# Patient Record
Sex: Male | Born: 1980 | Race: White | Hispanic: No | Marital: Single | Smoking: Never smoker
Health system: Southern US, Community
[De-identification: ages and names within clinical notes are randomized; demographics above are authoritative.]

## PROBLEM LIST (undated history)

## (undated) DIAGNOSIS — I10 Essential (primary) hypertension: Secondary | ICD-10-CM

## (undated) DIAGNOSIS — E785 Hyperlipidemia, unspecified: Secondary | ICD-10-CM

## (undated) DIAGNOSIS — G4730 Sleep apnea, unspecified: Secondary | ICD-10-CM

## (undated) DIAGNOSIS — Z87442 Personal history of urinary calculi: Secondary | ICD-10-CM

## (undated) DIAGNOSIS — G8929 Other chronic pain: Secondary | ICD-10-CM

## (undated) DIAGNOSIS — E559 Vitamin D deficiency, unspecified: Secondary | ICD-10-CM

## (undated) DIAGNOSIS — M109 Gout, unspecified: Secondary | ICD-10-CM

## (undated) DIAGNOSIS — R000 Tachycardia, unspecified: Secondary | ICD-10-CM

## (undated) DIAGNOSIS — J40 Bronchitis, not specified as acute or chronic: Secondary | ICD-10-CM

## (undated) DIAGNOSIS — M549 Dorsalgia, unspecified: Secondary | ICD-10-CM

## (undated) HISTORY — PX: FOOT SURGERY: SHX648

## (undated) HISTORY — PX: HERNIA REPAIR: SHX51

## (undated) HISTORY — DX: Gout, unspecified: M10.9

## (undated) HISTORY — DX: Vitamin D deficiency, unspecified: E55.9

---

## 2000-04-26 ENCOUNTER — Encounter: Payer: Self-pay | Admitting: Emergency Medicine

## 2000-04-26 ENCOUNTER — Emergency Department (HOSPITAL_COMMUNITY): Admission: EM | Admit: 2000-04-26 | Discharge: 2000-04-26 | Payer: Self-pay | Admitting: Emergency Medicine

## 2000-05-31 ENCOUNTER — Encounter: Payer: Self-pay | Admitting: Occupational Medicine

## 2000-05-31 ENCOUNTER — Ambulatory Visit (HOSPITAL_COMMUNITY): Admission: RE | Admit: 2000-05-31 | Discharge: 2000-05-31 | Payer: Self-pay | Admitting: Occupational Medicine

## 2000-06-15 ENCOUNTER — Encounter: Admission: RE | Admit: 2000-06-15 | Discharge: 2000-09-13 | Payer: Self-pay | Admitting: Orthopedic Surgery

## 2000-08-30 ENCOUNTER — Emergency Department (HOSPITAL_COMMUNITY): Admission: EM | Admit: 2000-08-30 | Discharge: 2000-08-31 | Payer: Self-pay | Admitting: Emergency Medicine

## 2001-04-04 ENCOUNTER — Emergency Department (HOSPITAL_COMMUNITY): Admission: EM | Admit: 2001-04-04 | Discharge: 2001-04-04 | Payer: Self-pay | Admitting: Emergency Medicine

## 2001-04-04 ENCOUNTER — Encounter: Payer: Self-pay | Admitting: Emergency Medicine

## 2002-11-20 ENCOUNTER — Ambulatory Visit (HOSPITAL_COMMUNITY): Admission: RE | Admit: 2002-11-20 | Discharge: 2002-11-20 | Payer: Self-pay | Admitting: Podiatry

## 2003-03-28 ENCOUNTER — Ambulatory Visit (HOSPITAL_COMMUNITY): Admission: RE | Admit: 2003-03-28 | Discharge: 2003-03-28 | Payer: Self-pay | Admitting: Podiatry

## 2005-01-14 ENCOUNTER — Emergency Department (HOSPITAL_COMMUNITY): Admission: EM | Admit: 2005-01-14 | Discharge: 2005-01-14 | Payer: Self-pay | Admitting: Family Medicine

## 2005-09-06 ENCOUNTER — Emergency Department (HOSPITAL_COMMUNITY): Admission: EM | Admit: 2005-09-06 | Discharge: 2005-09-06 | Payer: Self-pay | Admitting: Family Medicine

## 2006-04-11 ENCOUNTER — Emergency Department (HOSPITAL_COMMUNITY): Admission: EM | Admit: 2006-04-11 | Discharge: 2006-04-11 | Payer: Self-pay | Admitting: Emergency Medicine

## 2008-03-05 ENCOUNTER — Emergency Department (HOSPITAL_COMMUNITY): Admission: EM | Admit: 2008-03-05 | Discharge: 2008-03-05 | Payer: Self-pay | Admitting: Emergency Medicine

## 2008-05-24 ENCOUNTER — Emergency Department (HOSPITAL_COMMUNITY): Admission: EM | Admit: 2008-05-24 | Discharge: 2008-05-24 | Payer: Self-pay | Admitting: Family Medicine

## 2009-04-13 ENCOUNTER — Emergency Department (HOSPITAL_COMMUNITY): Admission: EM | Admit: 2009-04-13 | Discharge: 2009-04-13 | Payer: Self-pay | Admitting: Emergency Medicine

## 2009-10-26 ENCOUNTER — Emergency Department (HOSPITAL_COMMUNITY): Admission: EM | Admit: 2009-10-26 | Discharge: 2009-10-26 | Payer: Self-pay | Admitting: Family Medicine

## 2010-06-19 ENCOUNTER — Ambulatory Visit (HOSPITAL_COMMUNITY): Admission: RE | Admit: 2010-06-19 | Discharge: 2010-06-19 | Payer: Self-pay | Admitting: Family Medicine

## 2011-03-27 NOTE — Op Note (Signed)
NAME:  Kevin Pugh, Kevin Pugh                           ACCOUNT NO.:  [id number]   MEDICAL RECORD NO.:  [id number]                   PATIENT TYPE:  AMB   LOCATION:  DAY                                  FACILITY:  [facility]   PHYSICIAN:  Denny Peon. Ulice Brilliant, D.P.M.               DATE OF BIRTH:  10/07/1981   DATE OF PROCEDURE:  DATE OF DISCHARGE:                                 OPERATIVE REPORT   PREOPERATIVE DIAGNOSIS:  Nonimproving chronic plantar fasciitis, left foot.   POSTOPERATIVE DIAGNOSIS:  Nonimproving chronic plantar fasciitis, left foot.   PROCEDURE PERFORMED:  Endoscopic plantar fasciotomy, left foot.   SURGEON:  Denny Peon. Ulice Brilliant, D.P.M.   ANESTHESIA:  Monitored anesthesia care.   INDICATIONS FOR SURGERY:  Several year history of classic plantar  fasciitis/heel spur symptom complaints of the left heel.  The patient  has  been treated with functional foot orthotics, nonsteroidal anti-inflammatory  medication, numerous corticosteroid injections, home physical therapy, shock  wave therapy.  All of which has offered little long lasting relief as  patient is not improving, he has been suggested that an endoscopic plantar  fasciotomy is in order. Clinically, he is noted to have a pes valgus foot  type with a plantar calcaneal spur on lateral weightbearing radiograph.  Clinically, he is extremely tender to palpation of the plantar medial and  plantar central calcaneal spur area.   DESCRIPTION OF PROCEDURE:  The patient  is brought into the OR and placed on  the table in supine position.  IV sedation was established.  A posterior  tibial nerve block is performed about his left ankle.  Further local  anesthesia is administered about the medial and lateral aspect of his left  heel.  A pneumatic ankle tourniquet is then applied across his left ankle.  His foot is then prepped and draped in the usual aseptic fashion.  Care is  taken that his foot is hanging off the bed at least four inches so we can  get good exposure.  An Esmarch bandage is then utilized to exsanguinate his  foot.  The tourniquet is inflated to 300 mmHg.   PROCEDURE PERFORMED:  Endoscopic plantar fasciotomy of left foot.  Attention  is directed to the left heel.  The preoperative weightbearing radiograph  measured 5.5 cm from the posterior skin line to the distal most aspect of  the plantar calcaneal spur.  This measurement is reapproximated on the  patient's foot with a sterile ruler.  The spur is also palpated.  A 1-cm  vertical incision is made approximately  1 cm distal to the plantar  calcaneal spur overlying the medial band of the fascia.  The incision is  deepened via blunt dissection with a curved hemostat.  The medial band of  the plantar fascia is well appreciated via tactile sensation through the  curved hemostat.  The channel is then deepened and lengthened running  from  medial to lateral across the plantar aspect of his foot just inferior to the  fascia utilizing the spatula from the EPF instrumentation.  Care is taken to  get broadening and deepening of the tunnel utilizing the spatula.  With the  created, the obturator cannula is inserted medially and driven laterally  through this channel to the lateral aspect of the foot.  A separate lateral  incision is then made.  The obturator cannula is passed through and the  obturator is removed with the cannula rotated so that slot laid directly  against the fascia.  The fiberoptic equipment is inserted medially and the  hook probe is inserted laterally.  The fascia is well visualized on the  monitor.  The lateral most aspect of the central band of the fascia is  roughly approximated utilizing measurements with the hook probe. A  transection is then created at this lateral most aspect of the central band  and utilizing the visualization on the monitor the transection is carried  forth from lateral to medial through the central band and the medial band  with  good visualization of the intrinsic muscle belly lying beneath the  fascia.  The transection is deemed successful at this point.  All work is  checked, then by inserting the camera laterally and the hook probe medially  and the work is inspected and found that the intrinsic muscle belly is  completely visualized from medial through the central band.  The cannula is  then rotated so that its slow lay against the adipose tissue plantarly and  this is well visualized to contain only adipose tissue with no fascia.  The  transection is deemed complete at this point.  The wound is flushed with  copious amounts of irrigant.  A 1cc injection of dexamethasone phosphate is  delivered into the spur site.  The incisions are closed with 4-0 Prolene.  An Adaptic dressing is applied to each incision.  4 x 4's are applied across  the plantar, posterior and dorsal aspect of the foot.  The dressing is  secured.  The draping is removed.  The tourniquet is removed and then the  dressing is carried forth over the ankle.  The tourniquet time before  deflation was 17 minutes.  The patient  is then taken from the table and  rolled over onto the stretcher in the prone position.  His leg is then bent  at the knee and a BK fiberglass cast is applied with the foot being held  passed perpendicular into three to four degrees of dorsiflexion at the  ankle.  This position is maintained while the cast dried.   The patient is transported from the operating room to outpatient recovery  without incident.  While there, a list of written instructions were  explained to he and his wife.  A prescription for Lortab 10/650 is  dispensed.  He will be seen within seven days for his first postop cast  check.  His instructions include being nonweightbearing on this cast on  crutches for the first week.  I will see the patient as stated within a  week.                                              Denny Peon. Ulice Brilliant, D.P.M.     CMD/MEDQ  D:  11/20/2002  T:  11/20/2002  Job:  [id number]

## 2011-03-27 NOTE — H&P (Signed)
   NAME:  Kevin Pugh, Kevin Pugh                           ACCOUNT NO.:  [id number]   MEDICAL RECORD NO.:  [id number]                   PATIENT TYPE:  AMB   LOCATION:  DAY                                  FACILITY:  [facility]   PHYSICIAN:  Denny Peon. Ulice Brilliant, D.P.M.               DATE OF BIRTH:  1981-07-26   DATE OF ADMISSION:  DATE OF DISCHARGE:                                HISTORY & PHYSICAL   HISTORY OF PRESENT ILLNESS:  The patient has chronic plantar fasciitis, heel  spur symptoms of his right foot.  Originally this was of both heels.  He has  been treated conservatively for this right heel with injection therapy,  nonsteroidal anti-inflammatory medication, changes in shoe gear, orthotic  therapy, shock wave therapy.  He has undergone the endoscopic plantar  fasciotomy in his left foot and has improved and would like to go ahead and  do this with his right foot.   PAST MEDICAL HISTORY:  Significant for an eye infection at this point.  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   CURRENT MEDICATIONS:  1. Basically amitriptyline for help with sleeping.  2. He has taken hydrocodone for postoperative pain with his left foot.   PAST SURGICAL HISTORY:  Includes the previous endoscopic plantar fasciotomy  on his left foot in January .   ALLERGIES:  KEFLEX AND PREDNISONE ORALLY.   PHYSICAL EXAMINATION:  The patient has pain to palpation of the right heel  in the plantar medial and plantar central aspect of the heel.  Radiographs  reveal a plantar calcaneal spur, mild of this right foot.   ASSESSMENT:  Plantar fasciitis heel spur syndrome right.   PLAN:  Due to the non improving nature the patient has requested an  endoscopic plantar fasciotomy.  I agree with his reasoning as he has really  had only mild improvement at best with the above-noted measures.  We will go  ahead and schedule  this under MAC anesthesia at [facility]. He has  read his consent form, apparently understood and signed.  We discussed with  him the risks and usual postoperative outcome.                                               Denny Peon. Ulice Brilliant, D.P.M.    CMD/MEDQ  D:  03/27/2003  T:  03/27/2003  Job:  [id number]

## 2011-03-27 NOTE — H&P (Signed)
NAME:  Kevin Pugh, Kevin Pugh                           ACCOUNT NO.:  [id number]   MEDICAL RECORD NO.:  [id number]                   PATIENT TYPE:  AMB   LOCATION:  DAY                                  FACILITY:  [facility]   PHYSICIAN:  Denny Peon. Ulice Brilliant, D.P.M.               DATE OF BIRTH:  02/08/1981   DATE OF ADMISSION:  11/20/2002  DATE OF DISCHARGE:                                HISTORY & PHYSICAL   HISTORY OF PRESENT ILLNESS:  The patient has plantar fasciitis heel spur  symptoms which have been present for over a year.  The patient's condition  has gotten increasingly worse.  He was treated with nonsteroidal anti-  inflammatory medication, functional foot orthotics, and injection therapy,  all of which seemed to offer some relief, but continued to worsen.  On  September 08, 2002, he underwent shock wave therapy in our office to both  feet.  His right foot has improved, however, his left foot has not improved.  He relates that the condition and the feeling are still the same in terms of  extreme discomfort when he first gets up or upon arising after sitting with  a continual throbbing sensation in his plantar heel.   PAST MEDICAL HISTORY:  Noncontributory and essentially unremarkable.   ALLERGIES:  He has a history of seasonal allergies, as well as some  breathing difficulty possibly due to asthma.  He relates an allergy to  [facility] and to PREDNISONE.   MEDICATIONS:  He is taking albuterol and Zyrtec at this point and he  recently has been taking hydrocodone for discomfort.   OBJECTIVE:  The patient is very uncomfortable to deep palpation in the  plantar medial region of this left heel.  Specifically tender to palpation  at the left plantar medial calcaneal tubercle.  Radiographs reveal minimal  calcaneal spurring.   ASSESSMENT:  Plantar fasciitis/heel spur symptom of left foot.   PLAN:  The patient has been through basically all conservative options that  are normally offered.  He has  also undergone shock wave therapy, which has  not eradicated the pain.  We have discussed the endoscopic plantar  fasciotomy.  At this point, I think this is probably warranted because he is  just not improving.  The patient and I have discussed this.  He would like  to have this done at [facility] as he is an employee of the [employer].  We have discussed the procedure with him and the  usual postoperative course.  This will be done under monitored anesthesia  care at [facility] on November 20, 2002.  He will be seen within  seven days for his first postoperative visit.  The patient has read the  consent, apparently understood, and signed.  Denny Peon. Ulice Brilliant, D.P.M.    CMD/MEDQ  D:  11/17/2002  T:  11/17/2002  Job:  [id number]

## 2011-03-27 NOTE — Op Note (Signed)
NAME:  Kevin Pugh, Kevin Pugh                           ACCOUNT NO.:  [id number]   MEDICAL RECORD NO.:  [id number]                   PATIENT TYPE:  AMB   LOCATION:  DAY                                  FACILITY:  [facility]   PHYSICIAN:  Denny Peon. Ulice Brilliant, D.P.M.               DATE OF BIRTH:  Jan 31, 1981   DATE OF PROCEDURE:  03/28/2003  DATE OF DISCHARGE:                                 OPERATIVE REPORT   PREOPERATIVE DIAGNOSIS:  Plantar fasciitis heel spur syndrome, right foot.   POSTOPERATIVE DIAGNOSIS:  Plantar fasciitis heel spur syndrome, right foot.   PROCEDURE:  Endoscopic plantar fasciotomy, right foot.   SURGEON:  Denny Peon. Ulice Brilliant, D.P.M.   ANESTHESIA:  Monitored anesthesia care.   INDICATIONS FOR PROCEDURE:  Painful longstanding plantar fasciitis which has  been treated conservatively fairly aggressively with nonsteroidal  antiinflammatory medication, injection therapy, functional foot orthotics,  time out of work, shockwave therapy, all of which has helped to a limited  extent only to have the condition recur and become more painful. Clinically  very uncomfortable palpation about the medial and central plantar aspect of  the heel. Pain early in the morning or upon arising after sitting, worsening  in the afternoon and stiffening. Pain is localized, not radiating.   DESCRIPTION OF PROCEDURE:  Kevin Pugh is brought into the OR and placed on the  table in the supine position, IV sedation is established, a posterior tibial  nerve block is then performed about his right ankle. Further local  anesthesia is administered about the medial and lateral aspect of the right  heel. A pneumatic ankle tourniquet is then applied across his right ankle  over appropriate cast padding. His foot is then prepped and draped in the  usual standard aseptic fashion. An ACE bandage was then utilized to examine  his foot, the tourniquet is inflated to 250 mmHg.   ENDOSCOPIC PLANTAR FASCIOTOMY OF RIGHT FOOT:  Attention  is directed to the  right heel, the medial calcaneal tubercle is appreciated. An incision is  then planned 1 cm distal to the fall off of the calcaneous over the medial  band of the fascia with the incision planned and checked. It is then made  with a #15 blade. The incision is deepened by a blunt dissection to the  plantar fascia. A channel is then created working inferior to the fascia  from medial to lateral across the plantar aspect of the foot. This is  created with a freer elevator and then broadened and deepened with the  spatula from the ____ with instrumentation. The obturator cannula was then  introduced and driven from medial to lateral across the plantar aspect of  the foot in this channel. A separate incision is made laterally to past the  obturator through. With this in place, the cannula was rotated so that its  slot lay against the fascia and the obturator was  removed. The camera was  inserted medially and the hook probe was inserted laterally. The lateral  most aspect of the central band of the fascia is approximated. A transection  is then made here with a triangle knife. This transection is then worked  from lateral to medial across the plantar aspect of the foot. The cannula  was then rotated so that its slot lay against the adipose tissue plantarly  and there is no remaining plantar aponeurosis here. The instrumentation is  then reversed, the camera is inserted laterally and there is found to be no  remaining plantar fascial tissue intact. The intrinsic muscle belly is well  visualized in the area of the transection. The fasciotomy is deemed  complete, the wound is flushed with copious amounts of irrigant, the cannula  is removed. The incisions are closed with 4-0 Prolene, postoperative  injection of Hexadrol is dispensed. Adaptic dressing is applied across each  incision, a dry sterile compressive dressing follows. The tourniquet was  deflated with tourniquet time 20  minutes. The tourniquet was then removed  and the dressing was taken up to the lower leg. The patient is then fitted  with a Cam walker.   Kevin Pugh tolerated this procedure well, transported to day hospital without  incident, while there a list of instructions were explained to him. He will  be seen within seven days for first postop visit.                                               Denny Peon. Ulice Brilliant, D.P.M.    CMD/MEDQ  D:  03/28/2003  T:  03/28/2003  Job:  [id number]

## 2011-04-02 ENCOUNTER — Ambulatory Visit: Payer: Medicaid Other | Attending: Family Medicine | Admitting: Physical Therapy

## 2011-04-02 DIAGNOSIS — R5381 Other malaise: Secondary | ICD-10-CM | POA: Insufficient documentation

## 2011-04-02 DIAGNOSIS — IMO0001 Reserved for inherently not codable concepts without codable children: Secondary | ICD-10-CM | POA: Insufficient documentation

## 2011-04-02 DIAGNOSIS — M545 Low back pain, unspecified: Secondary | ICD-10-CM | POA: Insufficient documentation

## 2011-04-02 DIAGNOSIS — R293 Abnormal posture: Secondary | ICD-10-CM | POA: Insufficient documentation

## 2011-04-08 ENCOUNTER — Encounter: Payer: Medicaid Other | Admitting: Physical Therapy

## 2011-04-09 ENCOUNTER — Encounter: Payer: Medicaid Other | Admitting: *Deleted

## 2011-04-10 ENCOUNTER — Ambulatory Visit: Payer: Medicaid Other | Attending: Family Medicine | Admitting: Physical Therapy

## 2011-04-10 DIAGNOSIS — M545 Low back pain, unspecified: Secondary | ICD-10-CM | POA: Insufficient documentation

## 2011-04-10 DIAGNOSIS — R293 Abnormal posture: Secondary | ICD-10-CM | POA: Insufficient documentation

## 2011-04-10 DIAGNOSIS — IMO0001 Reserved for inherently not codable concepts without codable children: Secondary | ICD-10-CM | POA: Insufficient documentation

## 2011-04-10 DIAGNOSIS — R5381 Other malaise: Secondary | ICD-10-CM | POA: Insufficient documentation

## 2011-04-15 ENCOUNTER — Ambulatory Visit: Payer: Medicaid Other | Admitting: Physical Therapy

## 2011-06-11 ENCOUNTER — Other Ambulatory Visit: Payer: Self-pay | Admitting: Nurse Practitioner

## 2011-06-11 ENCOUNTER — Other Ambulatory Visit: Payer: Self-pay | Admitting: Family Medicine

## 2011-06-11 DIAGNOSIS — M545 Low back pain: Secondary | ICD-10-CM

## 2011-06-11 DIAGNOSIS — M79605 Pain in left leg: Secondary | ICD-10-CM

## 2011-06-12 ENCOUNTER — Ambulatory Visit
Admission: RE | Admit: 2011-06-12 | Discharge: 2011-06-12 | Disposition: A | Discharge status: Home / Self Care | Payer: Medicaid Other | Source: Ambulatory Visit | Attending: Family Medicine | Admitting: Family Medicine

## 2011-06-12 DIAGNOSIS — M545 Low back pain: Secondary | ICD-10-CM

## 2011-06-12 DIAGNOSIS — M79605 Pain in left leg: Secondary | ICD-10-CM

## 2011-06-15 ENCOUNTER — Other Ambulatory Visit (HOSPITAL_COMMUNITY): Payer: Medicaid Other

## 2011-07-06 ENCOUNTER — Ambulatory Visit: Payer: Medicaid Other | Admitting: Physical Therapy

## 2011-07-08 ENCOUNTER — Ambulatory Visit: Payer: Medicaid Other | Admitting: Physical Therapy

## 2011-08-23 ENCOUNTER — Emergency Department (INDEPENDENT_AMBULATORY_CARE_PROVIDER_SITE_OTHER): Payer: Medicaid Other

## 2011-08-23 ENCOUNTER — Emergency Department (HOSPITAL_BASED_OUTPATIENT_CLINIC_OR_DEPARTMENT_OTHER)
Admission: EM | Admit: 2011-08-23 | Discharge: 2011-08-23 | Disposition: A | Discharge status: Home / Self Care | Payer: Medicaid Other | Attending: Emergency Medicine | Admitting: Emergency Medicine

## 2011-08-23 ENCOUNTER — Other Ambulatory Visit: Payer: Self-pay

## 2011-08-23 DIAGNOSIS — R0789 Other chest pain: Secondary | ICD-10-CM

## 2011-08-23 DIAGNOSIS — R112 Nausea with vomiting, unspecified: Secondary | ICD-10-CM | POA: Insufficient documentation

## 2011-08-23 DIAGNOSIS — I10 Essential (primary) hypertension: Secondary | ICD-10-CM | POA: Insufficient documentation

## 2011-08-23 DIAGNOSIS — E785 Hyperlipidemia, unspecified: Secondary | ICD-10-CM | POA: Insufficient documentation

## 2011-08-23 DIAGNOSIS — R091 Pleurisy: Secondary | ICD-10-CM | POA: Insufficient documentation

## 2011-08-23 DIAGNOSIS — J40 Bronchitis, not specified as acute or chronic: Secondary | ICD-10-CM

## 2011-08-23 DIAGNOSIS — R079 Chest pain, unspecified: Secondary | ICD-10-CM | POA: Insufficient documentation

## 2011-08-23 DIAGNOSIS — J45909 Unspecified asthma, uncomplicated: Secondary | ICD-10-CM | POA: Insufficient documentation

## 2011-08-23 HISTORY — DX: Essential (primary) hypertension: I10

## 2011-08-23 HISTORY — DX: Hyperlipidemia, unspecified: E78.5

## 2011-08-23 HISTORY — DX: Dorsalgia, unspecified: M54.9

## 2011-08-23 HISTORY — DX: Other chronic pain: G89.29

## 2011-08-23 HISTORY — DX: Bronchitis, not specified as acute or chronic: J40

## 2011-08-23 LAB — COMPREHENSIVE METABOLIC PANEL
ALT: 32 U/L (ref 0–53)
AST: 23 U/L (ref 0–37)
Albumin: 4 g/dL (ref 3.5–5.2)
Alkaline Phosphatase: 58 U/L (ref 39–117)
BUN: 18 mg/dL (ref 6–23)
CO2: 25 mEq/L (ref 19–32)
Calcium: 9.6 mg/dL (ref 8.4–10.5)
Chloride: 103 mEq/L (ref 96–112)
Creatinine, Ser: 1.1 mg/dL (ref 0.50–1.35)
GFR calc Af Amer: >90 mL/min (ref >90)
GFR calc non Af Amer: 88 mL/min — ABNORMAL LOW (ref >90)
Glucose, Bld: 115 mg/dL — ABNORMAL HIGH (ref 70–99)
Potassium: 3.8 mEq/L (ref 3.5–5.1)
Sodium: 139 mEq/L (ref 135–145)
Total Bilirubin: 0.2 mg/dL — ABNORMAL LOW (ref 0.3–1.2)
Total Protein: 7.1 g/dL (ref 6.0–8.3)

## 2011-08-23 LAB — LIPASE, BLOOD: Lipase: 40 U/L (ref 11–59)

## 2011-08-23 LAB — URINALYSIS, ROUTINE W REFLEX MICROSCOPIC
Glucose, UA: NEGATIVE mg/dL
Hgb urine dipstick: NEGATIVE
Ketones, ur: NEGATIVE mg/dL
Leukocytes, UA: NEGATIVE
Nitrite: NEGATIVE
Protein, ur: NEGATIVE mg/dL
Specific Gravity, Urine: 1.033 — ABNORMAL HIGH (ref 1.005–1.030)
Urobilinogen, UA: 0.2 mg/dL (ref 0.0–1.0)
pH: 5 (ref 5.0–8.0)

## 2011-08-23 LAB — TROPONIN I: Troponin I: <0.3 ng/mL (ref <0.30)

## 2011-08-23 LAB — CBC
HCT: 41 % (ref 39.0–52.0)
Hemoglobin: 14.1 g/dL (ref 13.0–17.0)
MCH: 30.3 pg (ref 26.0–34.0)
MCHC: 34.4 g/dL (ref 30.0–36.0)
MCV: 88.2 fL (ref 78.0–100.0)
Platelets: 293 10*3/uL (ref 150–400)
RBC: 4.65 MIL/uL (ref 4.22–5.81)
RDW: 13.1 % (ref 11.5–15.5)
WBC: 10.1 10*3/uL (ref 4.0–10.5)

## 2011-08-23 LAB — DIFFERENTIAL
Basophils Absolute: 0 10*3/uL (ref 0.0–0.1)
Basophils Relative: 0 % (ref 0–1)
Eosinophils Absolute: 0.2 10*3/uL (ref 0.0–0.7)
Eosinophils Relative: 2 % (ref 0–5)
Lymphocytes Relative: 24 % (ref 12–46)
Lymphs Abs: 2.4 10*3/uL (ref 0.7–4.0)
Monocytes Absolute: 0.8 10*3/uL (ref 0.1–1.0)
Monocytes Relative: 8 % (ref 3–12)
Neutro Abs: 6.6 10*3/uL (ref 1.7–7.7)
Neutrophils Relative %: 66 % (ref 43–77)

## 2011-08-23 LAB — D-DIMER, QUANTITATIVE: D-Dimer, Quant: <0.22 ug/mL-FEU (ref 0.00–0.48)

## 2011-08-23 MED ORDER — HYDROMORPHONE HCL 1 MG/ML IJ SOLN
INTRAMUSCULAR | Status: AC
  Administered 2011-08-23: 1 mg via INTRAVENOUS
  Filled 2011-08-23: qty 1

## 2011-08-23 MED ORDER — OXYCODONE-ACETAMINOPHEN 5-325 MG PO TABS: 1.0000 | ORAL_TABLET | Freq: Four times a day (QID) | ORAL | Status: AC | PRN

## 2011-08-23 MED ORDER — ONDANSETRON HCL 4 MG/2ML IJ SOLN
4.0000 mg | Freq: Once | INTRAMUSCULAR | Status: AC
  Administered 2011-08-23: 4 mg via INTRAVENOUS

## 2011-08-23 MED ORDER — HYDROMORPHONE HCL 1 MG/ML IJ SOLN
1.0000 mg | Freq: Once | INTRAMUSCULAR | Status: AC
  Administered 2011-08-23: 1 mg via INTRAVENOUS

## 2011-08-23 MED ORDER — ONDANSETRON HCL 4 MG/2ML IJ SOLN
INTRAMUSCULAR | Status: AC
  Administered 2011-08-23: 4 mg via INTRAVENOUS
  Filled 2011-08-23: qty 2

## 2011-08-23 MED ORDER — OXYCODONE-ACETAMINOPHEN 5-325 MG PO TABS
ORAL_TABLET | ORAL | Status: AC
  Administered 2011-08-23: 1 via ORAL
  Filled 2011-08-23: qty 1

## 2011-08-23 MED ORDER — OXYCODONE-ACETAMINOPHEN 5-325 MG PO TABS
1.0000 | ORAL_TABLET | Freq: Once | ORAL | Status: AC
  Administered 2011-08-23: 1 via ORAL

## 2011-08-23 MED ORDER — NAPROXEN 500 MG PO TABS: 500.0000 mg | ORAL_TABLET | Freq: Two times a day (BID) | ORAL | Status: AC

## 2011-08-23 MED ORDER — IBUPROFEN 800 MG PO TABS
ORAL_TABLET | ORAL | Status: AC
  Administered 2011-08-23: 800 mg via ORAL
  Filled 2011-08-23: qty 1

## 2011-08-23 MED ORDER — IBUPROFEN 800 MG PO TABS
800.0000 mg | ORAL_TABLET | Freq: Once | ORAL | Status: AC
  Administered 2011-08-23: 800 mg via ORAL

## 2011-08-23 NOTE — ED Notes (Signed)
Pt states that he has right sided chest pain which started about 1730 today, not associated with sob, however pt reports nausea, vomiting.  Increased WOB observed but pt reports no sob.

## 2011-08-23 NOTE — ED Provider Notes (Signed)
Scribed for Celene Kras, MD, the patient was seen in room MH08/MH08 . This chart was scribed by Ellie Lunch. This patient's care was started at 8:10 PM.   CSN: [id number] Arrival date & time: 08/23/2011  8:00 PM  Chief Complaint  Patient presents with  . Chest Pain    right sided chest pain    (Consider location/radiation/quality/duration/timing/severity/associated sxs/prior treatment) HPI Kevin Pugh is a 30 y.o. male who presents to the Emergency Department complaining of right sided chest pain starting about 3 hours ago.  Pain is described as sharp and radiates to back. Pt reports associated nausea and vomiting related to pain. Pt reports nothing aggravates or improves pain. Pt reports no hx of similar symptoms. Pt has recently had bronchitis. Pt denies SOB, urinary issues, edema or abd pain. Denies any recent long trips or hx of blood clots. There are no other associated symptoms and no other alleviating or aggravating factors.     Past Medical History  Diagnosis Date  . Hypertension   . Hyperlipemia   . Asthma   . Chronic back pain   . Bronchitis     Past Surgical History  Procedure Date  . Foot surgery     History reviewed. No pertinent family history.  History  Substance Use Topics  . Smoking status: Never Smoker   . Smokeless tobacco: Current User    Types: Snuff  . Alcohol Use: No      Review of Systems  Respiratory: Negative for shortness of breath.   Cardiovascular: Positive for chest pain. Negative for leg swelling.  Gastrointestinal: Positive for nausea and vomiting.  Genitourinary: Negative for dysuria and difficulty urinating.  All other systems reviewed and are negative.    Allergies  Prednisone  Home Medications  No current outpatient prescriptions on file.  BP 153/92  Pulse 96  Temp(Src) 98 F (36.7 C) (Oral)  Resp 17  Ht 6\' 1"  (1.854 m)  Wt 380 lb (172.367 kg)  BMI 50.13 kg/m2  SpO2 100%  Physical Exam  Nursing note and  vitals reviewed. Constitutional: He appears well-developed and well-nourished.       Appears uncomfortable.  Morbidly obese  HENT:  Head: Normocephalic and atraumatic.  Right Ear: External ear normal.  Left Ear: External ear normal.  Eyes: Conjunctivae are normal. Right eye exhibits no discharge. Left eye exhibits no discharge. No scleral icterus.  Neck: Neck supple. No tracheal deviation present.  Cardiovascular: Normal rate, regular rhythm and intact distal pulses.   Pulmonary/Chest: Effort normal and breath sounds normal. No stridor. No respiratory distress. He has no wheezes. He has no rales.  Abdominal: Soft. Bowel sounds are normal. He exhibits no distension. There is tenderness (Mild RUQ). There is no rebound, no guarding and no CVA tenderness.  Musculoskeletal: He exhibits no edema and no tenderness.       No cords on lower extremities  Neurological: He is alert. He has normal strength. No sensory deficit. Cranial nerve deficit:  no gross defecits noted. He exhibits normal muscle tone. He displays no seizure activity. Coordination normal.  Skin: Skin is warm and dry. No rash noted.  Psychiatric: He has a normal mood and affect.    ED Course  Procedures (including critical care time)  Date: 08/23/2011  Rate: 99  Rhythm: normal sinus rhythm  QRS Axis: right  Intervals: normal  ST/T Wave abnormalities: normal  Conduction Disutrbances:none  Narrative Interpretation:   Old EKG Reviewed: none available   OTHER DATA REVIEWED: Nursing  notes, vital signs, and past medical records reviewed.  DIAGNOSTIC STUDIES: Oxygen Saturation is 100% on room air, normal by my interpretation.    LABS / RADIOLOGY:  Labs Reviewed  COMPREHENSIVE METABOLIC PANEL - Abnormal; Notable for the following:    Glucose, Bld 115 (*)    Total Bilirubin 0.2 (*)    GFR calc non Af Amer 88 (*)    All other components within normal limits  URINALYSIS, ROUTINE W REFLEX MICROSCOPIC - Abnormal; Notable for  the following:    Specific Gravity, Urine 1.033 (*)    Bilirubin Urine SMALL (*)    All other components within normal limits  TROPONIN I  CBC  DIFFERENTIAL  D-DIMER, QUANTITATIVE  LIPASE, BLOOD   Dg Chest 2 View  08/23/2011  *RADIOLOGY REPORT*  Clinical Data: Right chest pain.  CHEST - 2 VIEW  Comparison: None.  Findings: Heart and mediastinal contours are within normal limits. No focal opacities or effusions.  No acute bony abnormality.  IMPRESSION: No active disease.  Original Report Authenticated By: Cyndie Chime, M.D.    ED COURSE / COORDINATION OF CARE: Patient treated with IV pain medications. Patient noted some relief of his symptoms. MDM: Patient without signs of pneumothorax, pneumonia on the workup. Low risk for PE with a negative d-dimer. I doubt cardiac etiology. Did discuss the possibility of biliary colic with his right-sided pain however he has not had any association with food and his LFTs and lipase are normal here.  Cautions were discussed with the patient although overall I feel he is sobered bronchitis and pleurisy.  MEDICATIONS GIVEN IN THE E.D.  Medications  HYDROmorphone (DILAUDID) injection 1 mg   ondansetron (ZOFRAN) injection 4 mg   HYDROmorphone (DILAUDID) 1 MG/ML injection   ondansetron (ZOFRAN) 4 MG/2ML injection   SCRIBE ATTESTATION: I personally performed the services described in this documentation, which was scribed in my presence.  The recorded information has been reviewed and considered.         Celene Kras, MD 08/23/11 2154

## 2011-08-23 NOTE — ED Notes (Signed)
ekg shown to Dr Lynelle Doctor

## 2011-08-23 NOTE — ED Notes (Signed)
Pt sent to restroom to provide urine specimen, instructed on clean catch procedure, voiced understanding.

## 2013-02-02 ENCOUNTER — Ambulatory Visit (INDEPENDENT_AMBULATORY_CARE_PROVIDER_SITE_OTHER): Payer: Medicaid Other

## 2013-02-02 ENCOUNTER — Ambulatory Visit (INDEPENDENT_AMBULATORY_CARE_PROVIDER_SITE_OTHER): Payer: Medicaid Other | Admitting: General Practice

## 2013-02-02 ENCOUNTER — Encounter: Payer: Self-pay | Admitting: General Practice

## 2013-02-02 VITALS — BP 150/98 | HR 90 | Temp 97.2°F | Ht 73.0 in | Wt 392.0 lb

## 2013-02-02 DIAGNOSIS — I10 Essential (primary) hypertension: Secondary | ICD-10-CM

## 2013-02-02 DIAGNOSIS — M79642 Pain in left hand: Secondary | ICD-10-CM

## 2013-02-02 DIAGNOSIS — M79609 Pain in unspecified limb: Secondary | ICD-10-CM

## 2013-02-02 MED ORDER — LISINOPRIL 40 MG PO TABS: 40.0000 mg | ORAL_TABLET | Freq: Every day | ORAL | Status: DC

## 2013-02-02 NOTE — Patient Instructions (Addendum)
Hypertension As your heart beats, it forces blood through your arteries. This force is your blood pressure. If the pressure is too high, it is called hypertension (HTN) or high blood pressure. HTN is dangerous because you may have it and not know it. High blood pressure may mean that your heart has to work harder to pump blood. Your arteries may be narrow or stiff. The extra work puts you at risk for heart disease, stroke, and other problems.  Blood pressure consists of two numbers, a higher number over a lower, 110/72, for example. It is stated as "110 over 72." The ideal is below 120 for the top number (systolic) and under 80 for the bottom (diastolic). Write down your blood pressure today. You should pay close attention to your blood pressure if you have certain conditions such as:  Heart failure.  Prior heart attack.  Diabetes  Chronic kidney disease.  Prior stroke.  Multiple risk factors for heart disease. To see if you have HTN, your blood pressure should be measured while you are seated with your arm held at the level of the heart. It should be measured at least twice. A one-time elevated blood pressure reading (especially in the Emergency Department) does not mean that you need treatment. There may be conditions in which the blood pressure is different between your right and left arms. It is important to see your caregiver soon for a recheck. Most people have essential hypertension which means that there is not a specific cause. This type of high blood pressure may be lowered by changing lifestyle factors such as:  Stress.  Smoking.  Lack of exercise.  Excessive weight.  Drug/tobacco/alcohol use.  Eating less salt. Most people do not have symptoms from high blood pressure until it has caused damage to the body. Effective treatment can often prevent, delay or reduce that damage. TREATMENT  When a cause has been identified, treatment for high blood pressure is directed at the  cause. There are a large number of medications to treat HTN. These fall into several categories, and your caregiver will help you select the medicines that are best for you. Medications may have side effects. You should review side effects with your caregiver. If your blood pressure stays high after you have made lifestyle changes or started on medicines,   Your medication(s) may need to be changed.  Other problems may need to be addressed.  Be certain you understand your prescriptions, and know how and when to take your medicine.  Be sure to follow up with your caregiver within the time frame advised (usually within two weeks) to have your blood pressure rechecked and to review your medications.  If you are taking more than one medicine to lower your blood pressure, make sure you know how and at what times they should be taken. Taking two medicines at the same time can result in blood pressure that is too low. SEEK IMMEDIATE MEDICAL CARE IF:  You develop a severe headache, blurred or changing vision, or confusion.  You have unusual weakness or numbness, or a faint feeling.  You have severe chest or abdominal pain, vomiting, or breathing problems. MAKE SURE YOU:   Understand these instructions.  Will watch your condition.  Will get help right away if you are not doing well or get worse. Document Released: 10/26/2005 Document Revised: 01/18/2012 Document Reviewed: 06/15/2008 [facility] Patient Information 2013 [place], [place].  Wrist Pain Wrist injuries are frequent in adults and children. A sprain is an injury  to the ligaments that hold your bones together. A strain is an injury to muscle or muscle cord-like structures (tendons) from stretching or pulling. Generally, when wrists are moderately tender to touch following a fall or injury, a break in the bone (fracture) may be present. Most wrist sprains or strains are better in 3 to 5 days, but complete healing may take several weeks. HOME  CARE INSTRUCTIONS   Put ice on the injured area.  Put ice in a plastic bag.  Place a towel between your skin and the bag.  Leave the ice on for 15 to 20 minutes, 3 to 4 times a day, for the first 2 days.  Keep your arm raised above the level of your heart whenever possible to reduce swelling and pain.  Rest the injured area for at least 48 hours or as directed by your caregiver.  If a splint or elastic bandage has been applied, use it for as long as directed by your caregiver or until seen by a caregiver for a follow-up exam.  Only take over-the-counter or prescription medicines for pain, discomfort, or fever as directed by your caregiver.  Keep all follow-up appointments. You may need to follow up with a specialist or have follow-up X-rays. Improvement in pain level is not a guarantee that you did not fracture a bone in your wrist. The only way to determine whether or not you have a broken bone is by X-ray. SEEK IMMEDIATE MEDICAL CARE IF:   Your fingers are swollen, very red, white, or cold and blue.  Your fingers are numb or tingling.  You have increasing pain.  You have difficulty moving your fingers. MAKE SURE YOU:   Understand these instructions.  Will watch your condition.  Will get help right away if you are not doing well or get worse. Document Released: 08/05/2005 Document Revised: 01/18/2012 Document Reviewed: 12/17/2010 [facility] Patient Information 2013 [place], [place].

## 2013-02-02 NOTE — Progress Notes (Signed)
  Subjective:    Patient ID: Kevin Pugh, male    DOB: [birth year], 32 y.o.   MRN: [id number]  HPI Patient presets today for blood pressure reading follow up. Previously 146/96 and today 150/98. Patient reports blood pressure when recorded at home 150-160/80-90. Patient reports eating a regular diet, but trying to decrease salt intake. Reports having no schedule routine to activity. Denies participating in activity that increases heart rate for 30 or more minutes a day. Patient reports he will increase physical activity now that weather is warming up, work on weight reduction, also healthier eating habits. Patient's blood pressure medication was recently increased and patient reports wanting to remain only on this medication for know and really work on the above management techniques.  Patient also complains of left lateral hand and wrist pain, from a fall 2 weeks ago. Complains of pain with certain movements. Patient tried to break his fall and landed on his left hand. Denies soaking hand. Tried antiinflammatory meds without success. Denies bleeding from injury.    Review of Systems  Constitutional: Negative for fever and chills.  Respiratory: Negative for chest tightness and shortness of breath.   Cardiovascular: Negative for chest pain and palpitations.  Genitourinary: Negative for difficulty urinating.  Neurological: Negative for dizziness and headaches.  Psychiatric/Behavioral: Negative.        Objective:   Physical Exam  Constitutional: He is oriented to person, place, and time. He appears well-developed and well-nourished.  Cardiovascular: Normal rate and regular rhythm.   Pulmonary/Chest: Effort normal and breath sounds normal.  Musculoskeletal: He exhibits no edema and no tenderness.  Patient unable to fully flex and dorsiflex left hand/wrist area due to pain. Tender with palpation to left lateral hand and wrist area.   Neurological: He is alert and oriented to person, place, and  time.  Skin: Skin is warm and dry.    [facility] reading (PRIMARY) by Ruthell Rummage, FNP-C,  No fracture or dislocation noted.                                       Assessment & Plan:  Xray pending (left hand) Take medications as prescribed Healthy eating habits and exercise Schedule appointment in 2 weeks for recheck and labs Patient verbalized understanding  Raymon Mutton, FNP-C

## 2013-02-22 ENCOUNTER — Ambulatory Visit: Payer: Medicaid Other | Admitting: General Practice

## 2013-03-06 ENCOUNTER — Telehealth: Payer: Self-pay | Admitting: Nurse Practitioner

## 2013-03-06 NOTE — Telephone Encounter (Signed)
Pt aware that we do not have any available appts today.  He really wanted to be seen today so i advised him to try the urgent care.

## 2013-03-23 ENCOUNTER — Other Ambulatory Visit: Payer: Self-pay

## 2013-03-23 DIAGNOSIS — I10 Essential (primary) hypertension: Secondary | ICD-10-CM

## 2013-03-23 MED ORDER — LISINOPRIL 40 MG PO TABS: 40.0000 mg | ORAL_TABLET | Freq: Every day | ORAL | Status: DC

## 2013-06-14 ENCOUNTER — Ambulatory Visit (INDEPENDENT_AMBULATORY_CARE_PROVIDER_SITE_OTHER): Payer: Medicaid Other | Admitting: General Practice

## 2013-06-14 ENCOUNTER — Encounter: Payer: Self-pay | Admitting: General Practice

## 2013-06-14 VITALS — BP 150/99 | HR 110 | Temp 98.3°F | Wt 380.0 lb

## 2013-06-14 DIAGNOSIS — M109 Gout, unspecified: Secondary | ICD-10-CM

## 2013-06-14 DIAGNOSIS — M10072 Idiopathic gout, left ankle and foot: Secondary | ICD-10-CM

## 2013-06-14 LAB — POCT CBC
Granulocyte percent: 77.4 %G (ref 37–80)
HCT, POC: 47.5 % (ref 43.5–53.7)
Hemoglobin: 16.1 g/dL (ref 14.1–18.1)
Lymph, poc: 1.8 (ref 0.6–3.4)
MCH, POC: 29.3 pg (ref 27–31.2)
MCHC: 33.8 g/dL (ref 31.8–35.4)
MCV: 86.9 fL (ref 80–97)
MPV: 8.4 fL (ref 0–99.8)
POC Granulocyte: 8.4 — AB (ref 2–6.9)
POC LYMPH PERCENT: 16.5 %L (ref 10–50)
Platelet Count, POC: 246 10*3/uL (ref 142–424)
RBC: 5.5 M/uL (ref 4.69–6.13)
RDW, POC: 13.9 %
WBC: 10.9 10*3/uL — AB (ref 4.6–10.2)

## 2013-06-14 MED ORDER — DICLOFENAC SODIUM 75 MG PO TBEC: 75.0000 mg | DELAYED_RELEASE_TABLET | Freq: Two times a day (BID) | ORAL | Status: DC

## 2013-06-14 MED ORDER — OXYCODONE-ACETAMINOPHEN 5-325 MG PO TABS: 1.0000 | ORAL_TABLET | Freq: Two times a day (BID) | ORAL | Status: DC | PRN

## 2013-06-14 MED ORDER — COLCHICINE 0.6 MG PO TABS: ORAL_TABLET | ORAL | Status: DC

## 2013-06-14 NOTE — Progress Notes (Signed)
  Subjective:    Patient ID: Kevin Pugh, male    DOB: [birth year], 32 y.o.   MRN: [id number]  HPI Patient presents with pain and redness to left foot 5th toe. He reports onset was Sunday morning and rates 8 on 1-10 scale. He reports a history of gout and the symptoms he is experiencing are the same. He reports taking voltaren for past three days without relief. He denies taking uloric for past two months.     Review of Systems  Constitutional: Negative for fever and chills.  Respiratory: Negative for chest tightness and shortness of breath.   Cardiovascular: Negative for chest pain and palpitations.  Musculoskeletal: Positive for joint swelling.       Left foot 5th toe tender and swollen       Objective:   Physical Exam  Constitutional: He is oriented to person, place, and time. He appears well-developed and well-nourished.  Cardiovascular: Regular rhythm and normal heart sounds.  Tachycardia present.   Pulmonary/Chest: Effort normal and breath sounds normal.  Musculoskeletal: He exhibits edema and tenderness.  Left foot 5th toe, erythema, warm to touch, slight edema, and tenderness upon palpation  Neurological: He is alert and oriented to person, place, and time.  Skin: Skin is warm and dry.  Psychiatric: He has a normal mood and affect.          Assessment & Plan:  1. Gout attack - Uric acid - POCT CBC - colchicine 0.6 MG tablet; Take 1.2 mg (2 tablets) initially, then 0.6mg  (1 tablet) one hour later, wait 12 hours then take 0.6mg  (1 tablet) twice daily.  Dispense: 60 tablet; Refill: 0  2. Gouty arthritis of toe, left - oxyCODONE-acetaminophen (ROXICET) 5-325 MG per tablet; Take 1 tablet by mouth 2 (two) times daily as needed for pain.  Dispense: 20 tablet; Refill: 0 - diclofenac (VOLTAREN) 75 MG EC tablet; Take 1 tablet (75 mg total) by mouth 2 (two) times daily.  Dispense: 14 tablet; Refill: 0 -avoid foods high in purine, discussed foods and drinks to avoid -RTO if  symptoms worsen and in 1 week for recheck  -Patient verbalized understanding -Coralie Keens, FNP-C

## 2013-06-14 NOTE — Patient Instructions (Addendum)
Gout  Gout is an inflammatory condition (arthritis) caused by a buildup of uric acid crystals in the joints. Uric acid is a chemical that is normally present in the blood. Under some circumstances, uric acid can form into crystals in your joints. This causes joint redness, soreness, and swelling (inflammation). Repeat attacks are common. Over time, uric acid crystals can form into masses (tophi) near a joint, causing disfigurement. Gout is treatable and often preventable.  CAUSES   The disease begins with elevated levels of uric acid in the blood. Uric acid is produced by your body when it breaks down a naturally found substance called purines. This also happens when you eat certain foods such as meats and fish. Causes of an elevated uric acid level include:   Being passed down from parent to child (heredity).   Diseases that cause increased uric acid production (obesity, psoriasis, some cancers).   Excessive alcohol use.   Diet, especially diets rich in meat and seafood.   Medicines, including certain cancer-fighting drugs (chemotherapy), diuretics, and aspirin.   Chronic kidney disease. The kidneys are no longer able to remove uric acid well.   Problems with metabolism.  Conditions strongly associated with gout include:   Obesity.   High blood pressure.   High cholesterol.   Diabetes.  Not everyone with elevated uric acid levels gets gout. It is not understood why some people get gout and others do not. Surgery, joint injury, and eating too much of certain foods are some of the factors that can lead to gout.  SYMPTOMS    An attack of gout comes on quickly. It causes intense pain with redness, swelling, and warmth in a joint.   Fever can occur.   Often, only one joint is involved. Certain joints are more commonly involved:   Base of the big toe.   Knee.   Ankle.   Wrist.   Finger.  Without treatment, an attack usually goes away in a few days to weeks. Between attacks, you usually will not have  symptoms, which is different from many other forms of arthritis.  DIAGNOSIS   Your caregiver will suspect gout based on your symptoms and exam. Removal of fluid from the joint (arthrocentesis) is done to check for uric acid crystals. Your caregiver will give you a medicine that numbs the area (local anesthetic) and use a needle to remove joint fluid for exam. Gout is confirmed when uric acid crystals are seen in joint fluid, using a special microscope. Sometimes, blood, urine, and X-ray tests are also used.  TREATMENT   There are 2 phases to gout treatment: treating the sudden onset (acute) attack and preventing attacks (prophylaxis).  Treatment of an Acute Attack   Medicines are used. These include anti-inflammatory medicines or steroid medicines.   An injection of steroid medicine into the affected joint is sometimes necessary.   The painful joint is rested. Movement can worsen the arthritis.   You may use warm or cold treatments on painful joints, depending which works best for you.   Discuss the use of coffee, vitamin C, or cherries with your caregiver. These may be helpful treatment options.  Treatment to Prevent Attacks  After the acute attack subsides, your caregiver may advise prophylactic medicine. These medicines either help your kidneys eliminate uric acid from your body or decrease your uric acid production. You may need to stay on these medicines for a very long time.  The early phase of treatment with prophylactic medicine can be associated   with an increase in acute gout attacks. For this reason, during the first few months of treatment, your caregiver may also advise you to take medicines usually used for acute gout treatment. Be sure you understand your caregiver's directions.  You should also discuss dietary treatment with your caregiver. Certain foods such as meats and fish can increase uric acid levels. Other foods such as dairy can decrease levels. Your caregiver can give you a list of foods  to avoid.  HOME CARE INSTRUCTIONS    Do not take aspirin to relieve pain. This raises uric acid levels.   Only take over-the-counter or prescription medicines for pain, discomfort, or fever as directed by your caregiver.   Rest the joint as much as possible. When in bed, keep sheets and blankets off painful areas.   Keep the affected joint raised (elevated).   Use crutches if the painful joint is in your leg.   Drink enough water and fluids to keep your urine clear or pale yellow. This helps your body get rid of uric acid. Do not drink alcoholic beverages. They slow the passage of uric acid.   Follow your caregiver's dietary instructions. Pay careful attention to the amount of protein you eat. Your daily diet should emphasize fruits, vegetables, whole grains, and fat-free or low-fat milk products.   Maintain a healthy body weight.  SEEK MEDICAL CARE IF:    You have an oral temperature above 102 F (38.9 C).   You develop diarrhea, vomiting, or any side effects from medicines.   You do not feel better in 24 hours, or you are getting worse.  SEEK IMMEDIATE MEDICAL CARE IF:    Your joint becomes suddenly more tender and you have:   Chills.   An oral temperature above 102 F (38.9 C), not controlled by medicine.  MAKE SURE YOU:    Understand these instructions.   Will watch your condition.   Will get help right away if you are not doing well or get worse.  Document Released: 10/23/2000 Document Revised: 01/18/2012 Document Reviewed: 02/03/2010  [employer] Patient Information 2014 [employer].

## 2013-06-15 LAB — URIC ACID: Uric Acid: 9.7 mg/dL — ABNORMAL HIGH (ref 3.7–8.6)

## 2013-06-21 ENCOUNTER — Ambulatory Visit: Payer: Medicaid Other | Admitting: General Practice

## 2013-08-14 ENCOUNTER — Other Ambulatory Visit: Payer: Self-pay | Admitting: General Practice

## 2013-09-13 ENCOUNTER — Other Ambulatory Visit: Payer: Self-pay | Admitting: General Practice

## 2013-10-04 ENCOUNTER — Encounter: Payer: Self-pay | Admitting: Family Medicine

## 2013-10-04 ENCOUNTER — Encounter (INDEPENDENT_AMBULATORY_CARE_PROVIDER_SITE_OTHER): Payer: Self-pay

## 2013-10-04 ENCOUNTER — Ambulatory Visit (INDEPENDENT_AMBULATORY_CARE_PROVIDER_SITE_OTHER): Payer: Medicaid Other | Admitting: Family Medicine

## 2013-10-04 VITALS — BP 152/115 | HR 121 | Temp 98.9°F | Ht 73.0 in | Wt 384.0 lb

## 2013-10-04 DIAGNOSIS — R1031 Right lower quadrant pain: Secondary | ICD-10-CM

## 2013-10-04 DIAGNOSIS — G8929 Other chronic pain: Secondary | ICD-10-CM

## 2013-10-04 LAB — POCT CBC
Granulocyte percent: 71 %G (ref 37–80)
HCT, POC: 49.4 % (ref 43.5–53.7)
Hemoglobin: 16 g/dL (ref 14.1–18.1)
Lymph, poc: 2.3 (ref 0.6–3.4)
MCH, POC: 28.6 pg (ref 27–31.2)
MCHC: 32.5 g/dL (ref 31.8–35.4)
MCV: 88 fL (ref 80–97)
MPV: 8.1 fL (ref 0–99.8)
POC Granulocyte: 6.7 (ref 2–6.9)
POC LYMPH PERCENT: 24 %L (ref 10–50)
Platelet Count, POC: 301 10*3/uL (ref 142–424)
RBC: 5.6 M/uL (ref 4.69–6.13)
RDW, POC: 13.7 %
WBC: 9.4 10*3/uL (ref 4.6–10.2)

## 2013-10-04 NOTE — Progress Notes (Signed)
   Subjective:    Patient ID: Kevin Pugh, male    DOB: 1980/12/03, 32 y.o.   MRN: [id number]  HPI This 32 y.o. male presents for evaluation of right lower quadrant abdominal pain for 2 days. He has been feeling worse over the last 2 days and cannot sleep.  He is getting nauseated And he states he feels like he is going to break out in a sweat.   Review of Systems C/o abdominal pain No chest pain, SOB, HA, dizziness, vision change, N/V, diarrhea, constipation, dysuria, urinary urgency or frequency, myalgias, arthralgias or rash.     Objective:   Physical Exam Vital signs noted  Well developed well nourished male.  HEENT - Head atraumatic Normocephalic                Eyes - PERRLA, Conjuctiva - clear Sclera- Clear EOMI Respiratory - Lungs CTA bilateral Cardiac - RRR S1 and S2 without murmur GI - Abdomen tender RLQ with rebound tenderness at Mcburney's point. Extremities - No edema. Neuro - Grossly intact.   Results for orders placed in visit on 10/04/13  POCT CBC      Result Value Range   WBC 9.4  4.6 - 10.2 K/uL   Lymph, poc 2.3  0.6 - 3.4   POC LYMPH PERCENT 24.0  10 - 50 %L   POC Granulocyte 6.7  2 - 6.9   Granulocyte percent 71.0  37 - 80 %G   RBC 5.6  4.69 - 6.13 M/uL   Hemoglobin 16.0  14.1 - 18.1 g/dL   HCT, POC 14.7  82.9 - 53.7 %   MCV 88.0  80 - 97 fL   MCH, POC 28.6  27 - 31.2 pg   MCHC 32.5  31.8 - 35.4 g/dL   RDW, POC 56.2     Platelet Count, POC 301.0  142 - 424 K/uL   MPV 8.1  0 - 99.8 fL      Assessment & Plan:  Abdominal pain, chronic, right lower quadrant - Plan: POCT CBC Advised him he needs to go to ED to have CT of abdomen to rule out appendicitis and he Wants to go POV and will decide which ED he will go to when he gets somebody to drive him tonight.  Deatra Canter FNP

## 2013-10-06 ENCOUNTER — Encounter: Payer: Self-pay | Admitting: General Practice

## 2013-10-06 ENCOUNTER — Ambulatory Visit (INDEPENDENT_AMBULATORY_CARE_PROVIDER_SITE_OTHER): Payer: Medicaid Other | Admitting: General Practice

## 2013-10-06 VITALS — BP 170/109 | HR 115 | Temp 96.2°F | Ht 73.0 in | Wt 389.0 lb

## 2013-10-06 DIAGNOSIS — J0100 Acute maxillary sinusitis, unspecified: Secondary | ICD-10-CM

## 2013-10-06 MED ORDER — AZITHROMYCIN 250 MG PO TABS: ORAL_TABLET | ORAL | Status: DC

## 2013-10-06 NOTE — Progress Notes (Signed)
   Subjective:    Patient ID: Kevin Pugh, male    DOB: 06/14/1981, 32 y.o.   MRN: [id number]  Sinusitis This is a new problem. The current episode started in the past 7 days. The problem has been gradually worsening since onset. There has been no fever. His pain is at a severity of 0/10. Associated symptoms include sinus pressure. Pertinent negatives include no chills, congestion, headaches, shortness of breath or sore throat. Past treatments include oral decongestants.      Review of Systems  Constitutional: Negative for fever and chills.  HENT: Positive for sinus pressure. Negative for congestion and sore throat.   Respiratory: Negative for chest tightness and shortness of breath.   Cardiovascular: Negative for chest pain and palpitations.  Neurological: Negative for dizziness, weakness and headaches.       Objective:   Physical Exam  Constitutional: He is oriented to person, place, and time. He appears well-developed and well-nourished.  HENT:  Head: Normocephalic and atraumatic.  Right Ear: External ear normal.  Left Ear: External ear normal.  Nose: Right sinus exhibits maxillary sinus tenderness.  Mouth/Throat: No posterior oropharyngeal erythema.  Cardiovascular: Normal rate and normal heart sounds.   Pulmonary/Chest: Effort normal and breath sounds normal. No respiratory distress. He exhibits no tenderness.  Neurological: He is alert and oriented to person, place, and time.  Skin: Skin is warm and dry.  Psychiatric: He has a normal mood and affect.          Assessment & Plan:  1. Sinusitis, acute, maxillary - azithromycin (ZITHROMAX) 250 MG tablet; Take as directed  Dispense: 6 tablet; Refill: 0 -discussed and provided information on sinusitis -RTO if symptoms worsen or unresolved -Patient verbalized understanding Coralie Keens, FNP-C

## 2013-10-06 NOTE — Patient Instructions (Signed)

## 2013-10-15 ENCOUNTER — Other Ambulatory Visit: Payer: Self-pay | Admitting: General Practice

## 2013-12-06 ENCOUNTER — Telehealth: Payer: Self-pay | Admitting: Family Medicine

## 2013-12-06 NOTE — Telephone Encounter (Signed)
Pt c/o of right side pain x 1 day. No fever,no vomiting. Wanted appt for Thursday. Refused appt for today. Advised to go to ER if pain becomes worse. Appt made for Thursday.

## 2013-12-07 ENCOUNTER — Encounter (INDEPENDENT_AMBULATORY_CARE_PROVIDER_SITE_OTHER): Payer: Self-pay

## 2013-12-07 ENCOUNTER — Ambulatory Visit (INDEPENDENT_AMBULATORY_CARE_PROVIDER_SITE_OTHER): Payer: Medicaid Other | Admitting: Family Medicine

## 2013-12-07 ENCOUNTER — Encounter: Payer: Self-pay | Admitting: Family Medicine

## 2013-12-07 VITALS — BP 151/88 | HR 95 | Temp 99.0°F | Resp 24 | Ht 73.0 in | Wt 394.0 lb

## 2013-12-07 DIAGNOSIS — R1011 Right upper quadrant pain: Secondary | ICD-10-CM

## 2013-12-07 LAB — POCT CBC
Granulocyte percent: 67.2 %G (ref 37–80)
HCT, POC: 42.9 % — AB (ref 43.5–53.7)
Hemoglobin: 14 g/dL — AB (ref 14.1–18.1)
Lymph, poc: 1.8 (ref 0.6–3.4)
MCH, POC: 29 pg (ref 27–31.2)
MCHC: 32.6 g/dL (ref 31.8–35.4)
MCV: 88.9 fL (ref 80–97)
MPV: 8.1 fL (ref 0–99.8)
POC Granulocyte: 4.2 (ref 2–6.9)
POC LYMPH PERCENT: 28.2 %L (ref 10–50)
Platelet Count, POC: 227 10*3/uL (ref 142–424)
RBC: 4.8 M/uL (ref 4.69–6.13)
RDW, POC: 13.4 %
WBC: 6.3 10*3/uL (ref 4.6–10.2)

## 2013-12-07 MED ORDER — HYDROCODONE-ACETAMINOPHEN 5-325 MG PO TABS: 1.0000 | ORAL_TABLET | Freq: Four times a day (QID) | ORAL | Status: DC | PRN

## 2013-12-07 NOTE — Patient Instructions (Signed)
Abdominal Pain, Adult °Many things can cause abdominal pain. Usually, abdominal pain is not caused by a disease and will improve without treatment. It can often be observed and treated at home. Your health care provider will do a physical exam and possibly order blood tests and X-rays to help determine the seriousness of your pain. However, in many cases, more time must pass before a clear cause of the pain can be found. Before that point, your health care provider may not know if you need more testing or further treatment. °HOME CARE INSTRUCTIONS  °Monitor your abdominal pain for any changes. The following actions may help to alleviate any discomfort you are experiencing: °· Only take over-the-counter or prescription medicines as directed by your health care provider. °· Do not take laxatives unless directed to do so by your health care provider. °· Try a clear liquid diet (broth, tea, or water) as directed by your health care provider. Slowly move to a bland diet as tolerated. °SEEK MEDICAL CARE IF: °· You have unexplained abdominal pain. °· You have abdominal pain associated with nausea or diarrhea. °· You have pain when you urinate or have a bowel movement. °· You experience abdominal pain that wakes you in the night. °· You have abdominal pain that is worsened or improved by eating food. °· You have abdominal pain that is worsened with eating fatty foods. °SEEK IMMEDIATE MEDICAL CARE IF:  °· Your pain does not go away within 2 hours. °· You have a fever. °· You keep throwing up (vomiting). °· Your pain is felt only in portions of the abdomen, such as the right side or the left lower portion of the abdomen. °· You pass bloody or black tarry stools. °MAKE SURE YOU: °· Understand these instructions.   °· Will watch your condition.   °· Will get help right away if you are not doing well or get worse.   °Document Released: 08/05/2005 Document Revised: 08/16/2013 Document Reviewed: 07/05/2013 °[employer]® Patient  Information ©2014 [employer]. ° °

## 2013-12-07 NOTE — Progress Notes (Signed)
   Subjective:    Patient ID: Kevin Pugh, male    DOB: October 12, 1981, 33 y.o.   MRN: [id number]  HPI This 33 y.o. male presents for evaluation of right upper quadrant abdominal pain. He has nausea and pain after eating. The discomfort has been more persistent.   Review of Systems C/o abdominal pain No chest pain, SOB, HA, dizziness, vision change, N/V, diarrhea, constipation, dysuria, urinary urgency or frequency, myalgias, arthralgias or rash.     Objective:   Physical Exam  Vital signs noted  Well developed well nourished male.  HEENT - Head atraumatic Normocephalic                Eyes - PERRLA, Conjuctiva - clear Sclera- Clear EOMI                Ears - EAC's Wnl TM's Wnl Gross Hearing WNL                Throat - oropharanx wnl Respiratory - Lungs CTA bilateral Cardiac - RRR S1 and S2 without murmur GI - Abdomen with tenderness RUQ and positive Murphy's      Assessment & Plan:  Abdominal pain, right upper quadrant - Plan: HYDROcodone-acetaminophen (NORCO) 5-325 MG per tablet, US Abdomen Limited RUQ, Ambulatory referral to General Surgery, POCT CBC, Hepatic function panel, Sedimentation rate, CANCELED: POCT SEDIMENTATION RATE  Deatra CanterWilliam J Kayslee Furey FNP

## 2013-12-08 ENCOUNTER — Ambulatory Visit
Admission: RE | Admit: 2013-12-08 | Discharge: 2013-12-08 | Disposition: A | Discharge status: Home / Self Care | Payer: Medicaid Other | Source: Ambulatory Visit | Attending: Family Medicine | Admitting: Family Medicine

## 2013-12-08 DIAGNOSIS — R1011 Right upper quadrant pain: Secondary | ICD-10-CM

## 2013-12-08 LAB — SEDIMENTATION RATE: Sed Rate: 5 mm/hr (ref 0–15)

## 2013-12-08 LAB — HEPATIC FUNCTION PANEL
ALT: 19 IU/L (ref 0–44)
AST: 18 IU/L (ref 0–40)
Albumin: 4.2 g/dL (ref 3.5–5.5)
Alkaline Phosphatase: 77 IU/L (ref 39–117)
Bilirubin, Direct: 0.11 mg/dL (ref 0.00–0.40)
Total Bilirubin: 0.4 mg/dL (ref 0.0–1.2)
Total Protein: 6.5 g/dL (ref 6.0–8.5)

## 2013-12-11 ENCOUNTER — Telehealth: Payer: Self-pay | Admitting: Family Medicine

## 2013-12-11 ENCOUNTER — Other Ambulatory Visit: Payer: Self-pay | Admitting: Family Medicine

## 2013-12-11 NOTE — Telephone Encounter (Signed)
Discussed results with patient. Surgical appt pending.

## 2013-12-15 ENCOUNTER — Other Ambulatory Visit: Payer: Self-pay | Admitting: Family Medicine

## 2013-12-27 ENCOUNTER — Encounter (INDEPENDENT_AMBULATORY_CARE_PROVIDER_SITE_OTHER): Payer: Self-pay | Admitting: Surgery

## 2013-12-27 ENCOUNTER — Ambulatory Visit (INDEPENDENT_AMBULATORY_CARE_PROVIDER_SITE_OTHER): Payer: Medicaid Other | Admitting: Surgery

## 2013-12-27 VITALS — BP 129/87 | HR 78 | Temp 99.2°F | Resp 18 | Ht 73.0 in | Wt 395.5 lb

## 2013-12-27 DIAGNOSIS — K8010 Calculus of gallbladder with chronic cholecystitis without obstruction: Secondary | ICD-10-CM | POA: Insufficient documentation

## 2013-12-27 NOTE — Progress Notes (Signed)
General Surgery [facility]  Chief Complaint  Patient presents with  . New Evaluation    symptomatic gallstones - referral from Nils Pyle, FNP    HISTORY: Patient is a 33 year old male referred by his primary care provider for evaluation of right upper quadrant abdominal pain and cholelithiasis. Patient has had intermittent symptoms over the past month. He has had right upper quadrant pain radiating to the back. This has been associated with nausea. He notes fatty food intolerance. He denies jaundice. He denies acholic stools. He denies fevers or chills.  Patient was seen by his primary care provider. Abdominal ultrasound was obtained which shows multiple small gallstones. There was no biliary dilatation. Gallbladder wall was thickened at 4 mm. Liver was enlarged consistent with fatty infiltration. Laboratory studies showed normal liver function tests.  Previous abdominal surgery includes a pediatric hernia repair. Patient also had an episode of hepatitis A infection as a 34-year-old child.  Past Medical History  Diagnosis Date  . Hypertension   . Hyperlipemia   . Asthma   . Chronic back pain   . Bronchitis   . Gout   . Vitamin D deficiency     Current Outpatient Prescriptions  Medication Sig Dispense Refill  . albuterol (PROVENTIL HFA;VENTOLIN HFA) 108 (90 BASE) MCG/ACT inhaler Inhale 2 puffs into the lungs every 6 (six) hours as needed. For shortness of breath       . cetirizine (ZYRTEC) 10 MG tablet Take 10 mg by mouth daily.        . cholecalciferol (VITAMIN D) 1000 UNITS tablet Take 1,000 Units by mouth daily.        . Choline Fenofibrate (TRILIPIX PO) Take 1 tablet by mouth daily.        Marland Kitchen COLCRYS 0.6 MG tablet TAKE 2 TABLETS INITIALLY, THEN TAKE 1 TABLET 1 HOUR LATER, WAIT 12HOURS, THEN TAKE 1 TABLET TWICE DAILY  60 tablet  2  . diclofenac (VOLTAREN) 75 MG EC tablet Take 1 tablet (75 mg total) by mouth 2 (two) times daily.  14 tablet  0  . fish  oil-omega-3 fatty acids 1000 MG capsule Take 1 g by mouth daily.        Marland Kitchen HYDROcodone-acetaminophen (NORCO) 5-325 MG per tablet Take 1 tablet by mouth every 6 (six) hours as needed for moderate pain.  30 tablet  0  . lisinopril (PRINIVIL,ZESTRIL) 40 MG tablet TAKE ONE TABLET BY MOUTH ONE  TIME DAILY  30 tablet  5   No current facility-administered medications for this visit.    Allergies  Allergen Reactions  . Prednisone Anaphylaxis, Hives and Swelling  . Keflex [Cephalexin Monohydrate]     Like thrush on the outside of mouth    Family History  Problem Relation Age of Onset  . Multiple sclerosis Mother   . Diabetes Father   . Hypertension Father   . Hyperlipidemia Father   . Heart disease Father   . Asthma Son   . Asthma Son     History   Social History  . Marital Status: Single    Spouse Name: N/A    Number of Children: N/A  . Years of Education: N/A   Social History Main Topics  . Smoking status: Never Smoker   . Smokeless tobacco: Current User    Types: Snuff     Comment: occasionally  . Alcohol Use: No  . Drug Use: No  . Sexual Activity: None   Other Topics Concern  . None  Social History Narrative  . None    REVIEW OF SYSTEMS - PERTINENT POSITIVES ONLY: Denies jaundice. Denies acholic stools. Intermittent nausea. No emesis. No fever.  EXAMCeasar Mons: Filed Vitals:   12/27/13 0943  BP: 129/87  Pulse: 78  Temp: 99.2 F (37.3 C)  Resp: 18    GENERAL: well-developed, well-nourished, no acute distress; morbidly obese HEENT: normocephalic; pupils equal and reactive; sclerae clear; dentition good; mucous membranes moist NECK:  symmetric on extension; no palpable anterior or posterior cervical lymphadenopathy; no supraclavicular masses; no tenderness CHEST: clear to auscultation bilaterally without rales, rhonchi, or wheezes CARDIAC: regular rate and rhythm without significant murmur; peripheral pulses are full ABDOMEN: soft without distension; bowel sounds  present; no mass; no hepatosplenomegaly; no hernia; mild right upper quadrant tenderness to deep palpation EXT:  non-tender without edema; no deformity NEURO: no gross focal deficits; no sign of tremor   LABORATORY RESULTS: See [facility] (CHL-Epic) for most recent results  RADIOLOGY RESULTS: See [facility] (CHL-Epic) for most recent results  IMPRESSION: #1 symptomatic cholelithiasis, chronic cholecystitis #2 morbid obesity #3 fatty liver  PLAN: The patient and I discussed the above findings at length. We reviewed his ultrasound results and his laboratory studies. I provided him with written literature regarding cholecystectomy. I have recommended laparoscopic cholecystectomy with intraoperative cholangiography. We have discussed the procedure. We've discussed the hospital stay to be anticipated. We have discussed his recovery and return to activity. He understands and wishes to proceed in the near future.  The risks and benefits of the procedure have been discussed at length with the patient.  The patient understands the proposed procedure, potential alternative treatments, and the course of recovery to be expected.  All of the patient's questions have been answered at this time.  The patient wishes to proceed with surgery.  Velora Hecklerodd M. Wilson Dusenbery, MD, FACS General & Endocrine Surgery [facility]  Primary Care Physician: Rudi HeapMOORE, DONALD, MD

## 2013-12-27 NOTE — Patient Instructions (Signed)
  [facility]  LAPAROSCOPIC SURGERY - POST-OP INSTRUCTIONS  Always review your discharge instruction sheet given to you by the facility where your surgery was performed.  A prescription for pain medication may be given to you upon discharge.  Take your pain medication as prescribed.  If narcotic pain medicine is not needed, then you may take acetaminophen (Tylenol) or ibuprofen (Advil) as needed.  Take your usually prescribed medications unless otherwise directed.  If you need a refill on your pain medication, please contact your pharmacy.  They will contact our office to request authorization. Prescriptions will not be filled after 5 P.M. or on weekends.  You should follow a light diet the first few days after arrival home, such as soup and crackers or toast.  Be sure to include plenty of fluids daily.  Most patients will experience some swelling and bruising in the area of the incisions.  Ice packs will help.  Swelling and bruising can take several days to resolve.   It is common to experience some constipation if taking pain medication after surgery.  Increasing fluid intake and taking a stool softener (such as Colace) will usually help or prevent this problem from occurring.  A mild laxative (Milk of Magnesia or Miralax) should be taken according to package instructions if there are no bowel movements after 48 hours.  Unless discharge instructions indicate otherwise, you may remove your bandages 24-48 hours after surgery, and you may shower at that time.  You may have steri-strips (small skin tapes) in place directly over the incision.  These strips should be left on the skin for 7-10 days.  If your surgeon used skin glue on the incision, you may shower in 24 hours.  The glue will flake off over the next 2-3 weeks.  Any sutures or staples will be removed at the office during your follow-up visit.  ACTIVITIES:  You may resume regular (light) daily activities beginning the  next day-such as daily self-care, walking, climbing stairs-gradually increasing activities as tolerated.  You may have sexual intercourse when it is comfortable.  Refrain from any heavy lifting or straining until approved by your doctor.  You may drive when you are no longer taking prescription pain medication, you can comfortably wear a seatbelt, and you can safely maneuver your car and apply brakes.  You should see your doctor in the office for a follow-up appointment approximately 2-3 weeks after your surgery.  Make sure that you call for this appointment within a day or two after you arrive home to insure a convenient appointment time.  WHEN TO CALL YOUR DOCTOR: 1. Fever over 101.0 2. Inability to urinate 3. Continued bleeding from incision 4. Increased pain, redness, or drainage from the incision 5. Increasing abdominal pain  The clinic staff is available to answer your questions during regular business hours.  Please don't hesitate to call and ask to speak to one of the nurses for clinical concerns.  If you have a medical emergency, go to the nearest emergency room or call 911.  A surgeon from [facility] is always on call for the hospital.  Levis Nazir M. Neshawn Aird, MD, FACS [facility] Office: [phone] Toll Free:  [phone] FAX [phone]  Web site: [email or web address] 

## 2014-01-01 ENCOUNTER — Ambulatory Visit (INDEPENDENT_AMBULATORY_CARE_PROVIDER_SITE_OTHER): Payer: Medicaid Other

## 2014-01-01 ENCOUNTER — Ambulatory Visit (INDEPENDENT_AMBULATORY_CARE_PROVIDER_SITE_OTHER): Payer: Medicaid Other | Admitting: Family Medicine

## 2014-01-01 ENCOUNTER — Encounter: Payer: Self-pay | Admitting: Sports Medicine

## 2014-01-01 ENCOUNTER — Encounter: Payer: Self-pay | Admitting: Family Medicine

## 2014-01-01 ENCOUNTER — Ambulatory Visit (INDEPENDENT_AMBULATORY_CARE_PROVIDER_SITE_OTHER): Payer: Medicaid Other | Admitting: Sports Medicine

## 2014-01-01 VITALS — BP 131/91 | HR 113 | Temp 97.8°F | Ht 73.0 in | Wt 394.0 lb

## 2014-01-01 VITALS — BP 154/90 | HR 118 | Ht 73.0 in | Wt 395.0 lb

## 2014-01-01 DIAGNOSIS — M25562 Pain in left knee: Secondary | ICD-10-CM

## 2014-01-01 DIAGNOSIS — M25569 Pain in unspecified knee: Secondary | ICD-10-CM

## 2014-01-01 DIAGNOSIS — M109 Gout, unspecified: Secondary | ICD-10-CM | POA: Insufficient documentation

## 2014-01-01 MED ORDER — TRAMADOL HCL 50 MG PO TABS: ORAL_TABLET | ORAL | Status: DC

## 2014-01-01 NOTE — Progress Notes (Signed)
   Subjective:    Patient ID: Kevin Pugh, male    DOB: [birth year], 33 y.o.   MRN: [id number]  HPI Left knee pain x2 days. We'll copiously with significant left knee pain and swelling. No known injury. Based on morbidly obese with a prior history of gout flares of the knee. This feels like a gout flare. Left knee pain is generalized with anterior and posterior knee pain. No distal numbness or paresthesias. Does report a prior history of prednisone allergy with related anaphylaxis. No prior history of blood clots.   Review of Systems  All other systems reviewed and are negative.       Objective:   Physical Exam  Constitutional:  Morbidly obese  HENT:  Head: Normocephalic and atraumatic.  Eyes: Conjunctivae are normal. Pupils are equal, round, and reactive to light.  Neck: Normal range of motion. Neck supple.  Cardiovascular: Normal rate and regular rhythm.   Pulmonary/Chest: Effort normal and breath sounds normal.  Abdominal: Soft.  Musculoskeletal:       Legs: Positive for left knee generalized swelling and tenderness to palpation. Mild erythema diffusely. Decreased range of motion secondary to pain.  Neurological: He is alert.  Skin: Skin is warm.   [facility] reading (PRIMARY) by  Dr. Alvester MorinNewton  Preliminary left knee x-ray negative for any acute fracture dislocation but with noted markedly medial knee joint space narrowing.                                         Assessment & Plan:  Knee pain, left - Plan: DG Knee 1-2 Views Left  Differential diagnosis for her symptoms is very broad including gout, osteoarthritis, bursitis, Baker's cyst, DVT. Given degree of knee swelling as well as body habitus, recommend patient follow up with sports medicine for ultrasound-guided evaluation of the knee joint. DVT much lower in the differential. Wells score 0-1. Knee x-rays preliminary negative for any fracture dislocation. Discuss case with Dr. Karie Schwalbe. [place] [facility],  the patient will leave clinic and go directly there for further evaluation. Patient expressed understanding.

## 2014-01-01 NOTE — Progress Notes (Signed)
   Subjective:    I'm seeing this patient as a consultation for:  Dr. Alvester MorinNewton  CC: Left knee pain  HPI: Dr. Alvester MorinNewton called me over lunchtime about this very pleasant 33 year old male, he has a history of gout which sounds to have not been confirmed yet with arthrocentesis. He tells me that for the past several days he's had a gradual increase in pain in his left knee associated with swelling. He denies any trauma, no constitutional symptoms, symptoms are severe, persistent. Pain is localized at the joint lines as well as in the suprapatellar recess.  Past medical history, Surgical history, Family history not pertinant except as noted below, Social history, Allergies, and medications have been entered into the medical record, reviewed, and no changes needed.   Review of Systems: No headache, visual changes, nausea, vomiting, diarrhea, constipation, dizziness, abdominal pain, skin rash, fevers, chills, night sweats, weight loss, swollen lymph nodes, body aches, joint swelling, muscle aches, chest pain, shortness of breath, mood changes, visual or auditory hallucinations.   Objective:   General: Well Developed, well nourished, and in no acute distress.  Neuro/Psych: Alert and oriented x3, extra-ocular muscles intact, able to move all 4 extremities, sensation grossly intact. Skin: Warm and dry, no rashes noted.  Respiratory: Not using accessory muscles, speaking in full sentences, trachea midline.  Cardiovascular: Pulses palpable, no extremity edema. Abdomen: Does not appear distended. Left Knee: Visible and palpable effusion, tender to palpation at the medial and lateral joint lines, no warmth, induration, or erythema. ROM full in flexion and extension and lower leg rotation. Ligaments with solid consistent endpoints including ACL, PCL, LCL, MCL. Negative Mcmurray's, Apley's, and Thessalonian tests. Non painful patellar compression. Patellar glide without crepitus. Patellar and quadriceps  tendons unremarkable. Hamstring and quadriceps strength is normal.   Procedure: Real-time Ultrasound Guided aspiration/Injection of left knee Device: GE Logiq E  Verbal informed consent obtained.  Time-out conducted.  Noted no overlying erythema, induration, or other signs of local infection.  Skin prepped in a sterile fashion.  Local anesthesia: Topical Ethyl chloride.  With sterile technique and under real time ultrasound guidance:  Needle advanced into the suprapatellar recess, a total of approximately 20 cc of serosanguineous cloudy fluid was aspirated, syringe switched in 2 cc Kenalog 40, 4 cc lidocaine injected easily into the joint. Completed without difficulty  Pain immediately resolved suggesting accurate placement of the medication.  Advised to call if fevers/chills, erythema, induration, drainage, or persistent bleeding.  Images permanently stored and available for review in the ultrasound unit.  Impression: Technically successful ultrasound guided injection.  X-rays were reviewed and do show a mild to moderate degenerative change in the tibiofemoral as well as patellofemoral joint.  Impression and Recommendations:   This case required medical decision making of moderate complexity.

## 2014-01-01 NOTE — Assessment & Plan Note (Signed)
Aspiration and injection as above. Fluids and off for crystal analysis. Tramadol as needed for pain. Return to see me in one month for reevaluation.

## 2014-01-02 ENCOUNTER — Encounter: Payer: Self-pay | Admitting: Sports Medicine

## 2014-01-02 ENCOUNTER — Telehealth: Payer: Self-pay | Admitting: *Deleted

## 2014-01-02 LAB — SYNOVIAL CELL COUNT + DIFF, W/ CRYSTALS
Eosinophils-Synovial: 0 % (ref 0–1)
Lymphocytes-Synovial Fld: 16 % (ref 0–20)
Monocyte/Macrophage: 6 % — ABNORMAL LOW (ref 50–90)
Neutrophil, Synovial: 78 % — ABNORMAL HIGH (ref 0–25)
WBC, Synovial: 1560 cu mm — ABNORMAL HIGH (ref 0–200)

## 2014-01-02 MED ORDER — DEXAMETHASONE 4 MG PO TABS: 4.0000 mg | ORAL_TABLET | Freq: Two times a day (BID) | ORAL | Status: DC

## 2014-01-02 NOTE — Telephone Encounter (Signed)
Pt.notified

## 2014-01-02 NOTE — Telephone Encounter (Signed)
Pt states the tramadol didn't help with his pain at all and he could hardly work. I did let him know the medications will take a little bit to kick in. He wanted me to let you know.

## 2014-01-02 NOTE — Telephone Encounter (Signed)
Please ask him what his specific questions are regarding his results, there is still to be some pain and swelling, I just injected yesterday, the medicine takes a couple of days to really start working.

## 2014-01-02 NOTE — Telephone Encounter (Signed)
Pt called and wanted to more about his results and the medications that were given to him. He stated that he is having some problems with his knee swelling. He used an ice pack on it however its not any better. I will forward to pcp for advice.Loralee PacasBarkley, Sriyan Cutting [place]

## 2014-01-02 NOTE — Telephone Encounter (Signed)
Decadron added.

## 2014-01-05 ENCOUNTER — Telehealth: Payer: Self-pay | Admitting: Family Medicine

## 2014-01-05 LAB — BODY FLUID CULTURE
Gram Stain: NONE SEEN
Organism ID, Bacteria: NO GROWTH

## 2014-01-08 ENCOUNTER — Encounter (HOSPITAL_COMMUNITY): Payer: Self-pay | Admitting: Pharmacy Technician

## 2014-01-11 ENCOUNTER — Encounter (HOSPITAL_COMMUNITY)
Admission: RE | Admit: 2014-01-11 | Discharge: 2014-01-11 | Disposition: A | Discharge status: Home / Self Care | Payer: Medicaid Other | Source: Ambulatory Visit | Attending: Surgery | Admitting: Surgery

## 2014-01-11 ENCOUNTER — Ambulatory Visit (HOSPITAL_COMMUNITY)
Admission: RE | Admit: 2014-01-11 | Discharge: 2014-01-11 | Disposition: A | Discharge status: Home / Self Care | Payer: Medicaid Other | Source: Ambulatory Visit | Attending: Surgery | Admitting: Surgery

## 2014-01-11 ENCOUNTER — Encounter (HOSPITAL_COMMUNITY): Payer: Self-pay

## 2014-01-11 DIAGNOSIS — Z01818 Encounter for other preprocedural examination: Secondary | ICD-10-CM | POA: Insufficient documentation

## 2014-01-11 DIAGNOSIS — Z01810 Encounter for preprocedural cardiovascular examination: Secondary | ICD-10-CM | POA: Insufficient documentation

## 2014-01-11 DIAGNOSIS — Z01812 Encounter for preprocedural laboratory examination: Secondary | ICD-10-CM | POA: Insufficient documentation

## 2014-01-11 DIAGNOSIS — I10 Essential (primary) hypertension: Secondary | ICD-10-CM | POA: Insufficient documentation

## 2014-01-11 DIAGNOSIS — R000 Tachycardia, unspecified: Secondary | ICD-10-CM

## 2014-01-11 HISTORY — DX: Tachycardia, unspecified: R00.0

## 2014-01-11 HISTORY — DX: Sleep apnea, unspecified: G47.30

## 2014-01-11 HISTORY — DX: Personal history of urinary calculi: Z87.442

## 2014-01-11 LAB — BASIC METABOLIC PANEL
BUN: 13 mg/dL (ref 6–23)
CO2: 23 mEq/L (ref 19–32)
Calcium: 9.5 mg/dL (ref 8.4–10.5)
Chloride: 98 mEq/L (ref 96–112)
Creatinine, Ser: 0.93 mg/dL (ref 0.50–1.35)
GFR calc Af Amer: >90 mL/min (ref >90)
GFR calc non Af Amer: >90 mL/min (ref >90)
Glucose, Bld: 105 mg/dL — ABNORMAL HIGH (ref 70–99)
Potassium: 4.3 mEq/L (ref 3.7–5.3)
Sodium: 135 mEq/L — ABNORMAL LOW (ref 137–147)

## 2014-01-11 LAB — CBC
HCT: 42.9 % (ref 39.0–52.0)
Hemoglobin: 14.7 g/dL (ref 13.0–17.0)
MCH: 30.2 pg (ref 26.0–34.0)
MCHC: 34.3 g/dL (ref 30.0–36.0)
MCV: 88.3 fL (ref 78.0–100.0)
Platelets: 311 10*3/uL (ref 150–400)
RBC: 4.86 MIL/uL (ref 4.22–5.81)
RDW: 13.3 % (ref 11.5–15.5)
WBC: 12.5 10*3/uL — ABNORMAL HIGH (ref 4.0–10.5)

## 2014-01-11 NOTE — Progress Notes (Signed)
Quick Note:  These results are acceptable for scheduled surgery.  Kaylob Wallen M. Aarohi Redditt, MD, FACS [facility] Office: [phone]   ______ 

## 2014-01-11 NOTE — Patient Instructions (Signed)
   [facility]  ON:   Monday  3/9  REPORT TO  SHORT STAY CENTER AT:  5:30 AM      PHONE # FOR SHORT STAY IS [phone]  DO NOT EAT OR DRINK ANYTHING AFTER MIDNIGHT THE NIGHT BEFORE YOUR SURGERY.  YOU MAY BRUSH YOUR TEETH, RINSE OUT YOUR MOUTH--BUT NO WATER, NO FOOD, NO CHEWING GUM, NO MINTS, NO CANDIES, NO CHEWING TOBACCO.  PLEASE TAKE THE FOLLOWING MEDICATIONS THE AM OF YOUR SURGERY WITH A FEW SIPS OF WATER:  USE YOUR ALBUTEROL INHALER AND BRING TO HOSPITAL.  IF YOU HAVE SLEEP APNEA AND USE CPAP OR BIPAP--PLEASE BRING THE MASK AND THE TUBING.  DO NOT BRING YOUR MACHINE.  DO NOT BRING VALUABLES, MONEY, CREDIT CARDS.  DO NOT WEAR JEWELRY, MAKE-UP, NAIL POLISH AND NO METAL PINS OR CLIPS IN YOUR HAIR. CONTACT LENS, DENTURES / PARTIALS, GLASSES SHOULD NOT BE WORN TO SURGERY AND IN MOST CASES-HEARING AIDS WILL NEED TO BE REMOVED.  BRING YOUR GLASSES CASE, ANY EQUIPMENT NEEDED FOR YOUR CONTACT LENS. FOR PATIENTS ADMITTED TO THE HOSPITAL--CHECK OUT TIME THE DAY OF DISCHARGE IS 11:00 AM.  ALL INPATIENT ROOMS ARE PRIVATE - WITH BATHROOM, TELEPHONE, TELEVISION AND WIFI INTERNET.   FAILURE TO FOLLOW THESE INSTRUCTIONS MAY RESULT IN THE CANCELLATION OF YOUR SURGERY. PLEASE BE AWARE THAT YOU MAY NEED ADDITIONAL BLOOD DRAWN DAY OF YOUR SURGERY  PATIENT SIGNATURE_________________________________

## 2014-01-11 NOTE — Pre-Procedure Instructions (Signed)
PORTABLE EQUIPMENT TECH NOTIFIED [facility] BED NEEDED FOR Monday 3/9 SURGERY TIME IS 7:30 AM  PT'S WEIGHT [id number].  ORDER IS IN EPIC.

## 2014-01-11 NOTE — Pre-Procedure Instructions (Signed)
EKG AND CXR WERE DONE TODAY - PREOP AT [facility]. 

## 2014-01-14 NOTE — Anesthesia Preprocedure Evaluation (Addendum)
Anesthesia Evaluation  Patient identified by MRN, date of birth, ID band Patient awake    Reviewed: Allergy & Precautions, H&P , NPO status , Patient's Chart, lab work & pertinent test results  Airway Mallampati: III TM Distance: >3 FB Neck ROM: full    Dental  (+) Edentulous Upper, Dental Advisory Given   Pulmonary asthma , sleep apnea and Continuous Positive Airway Pressure Ventilation ,  bronchitis breath sounds clear to auscultation  Pulmonary exam normal       Cardiovascular Exercise Tolerance: Good hypertension, Pt. on medications Rhythm:regular Rate:Tachycardia  Tachycardia.  Never been worked up but no symptoms   Neuro/Psych negative neurological ROS  negative psych ROS   GI/Hepatic negative GI ROS, Neg liver ROS,   Endo/Other  negative endocrine ROSMorbid obesity  Renal/GU negative Renal ROS  negative genitourinary   Musculoskeletal   Abdominal (+) + obese,   Peds  Hematology negative hematology ROS (+)   Anesthesia Other Findings   Reproductive/Obstetrics negative OB ROS                        Anesthesia Physical Anesthesia Plan  ASA: III  Anesthesia Plan: General   Post-op Pain Management:    Induction: Intravenous  Airway Management Planned: Oral ETT and Video Laryngoscope Planned  Additional Equipment:   Intra-op Plan:   Post-operative Plan: Extubation in OR  Informed Consent: I have reviewed the patients History and Physical, chart, labs and discussed the procedure including the risks, benefits and alternatives for the proposed anesthesia with the patient or authorized representative who has indicated his/her understanding and acceptance.   Dental Advisory Given  Plan Discussed with: CRNA and Surgeon  Anesthesia Plan Comments:        Anesthesia Quick Evaluation

## 2014-01-15 ENCOUNTER — Encounter (HOSPITAL_COMMUNITY)
Admission: RE | Disposition: A | Discharge status: Home / Self Care | Payer: Self-pay | Source: Ambulatory Visit | Attending: Surgery

## 2014-01-15 ENCOUNTER — Observation Stay (HOSPITAL_COMMUNITY)
Admission: RE | Admit: 2014-01-15 | Discharge: 2014-01-16 | Disposition: A | Discharge status: Home / Self Care | Payer: Medicaid Other | Source: Ambulatory Visit | Attending: Surgery | Admitting: Surgery

## 2014-01-15 ENCOUNTER — Encounter (HOSPITAL_COMMUNITY): Payer: Self-pay | Admitting: *Deleted

## 2014-01-15 ENCOUNTER — Encounter (HOSPITAL_COMMUNITY): Payer: Medicaid Other | Admitting: Anesthesiology

## 2014-01-15 ENCOUNTER — Ambulatory Visit (HOSPITAL_COMMUNITY): Payer: Medicaid Other

## 2014-01-15 ENCOUNTER — Ambulatory Visit (HOSPITAL_COMMUNITY): Payer: Medicaid Other | Admitting: Anesthesiology

## 2014-01-15 DIAGNOSIS — I10 Essential (primary) hypertension: Secondary | ICD-10-CM | POA: Insufficient documentation

## 2014-01-15 DIAGNOSIS — K8010 Calculus of gallbladder with chronic cholecystitis without obstruction: Secondary | ICD-10-CM

## 2014-01-15 DIAGNOSIS — M109 Gout, unspecified: Secondary | ICD-10-CM | POA: Insufficient documentation

## 2014-01-15 DIAGNOSIS — E785 Hyperlipidemia, unspecified: Secondary | ICD-10-CM | POA: Insufficient documentation

## 2014-01-15 DIAGNOSIS — K7689 Other specified diseases of liver: Secondary | ICD-10-CM | POA: Insufficient documentation

## 2014-01-15 DIAGNOSIS — Z79899 Other long term (current) drug therapy: Secondary | ICD-10-CM | POA: Insufficient documentation

## 2014-01-15 DIAGNOSIS — G4730 Sleep apnea, unspecified: Secondary | ICD-10-CM | POA: Insufficient documentation

## 2014-01-15 DIAGNOSIS — J45909 Unspecified asthma, uncomplicated: Secondary | ICD-10-CM | POA: Insufficient documentation

## 2014-01-15 HISTORY — PX: CHOLECYSTECTOMY: SHX55

## 2014-01-15 SURGERY — LAPAROSCOPIC CHOLECYSTECTOMY WITH INTRAOPERATIVE CHOLANGIOGRAM
Anesthesia: General | Site: Abdomen

## 2014-01-15 MED ORDER — LACTATED RINGERS IV SOLN
INTRAVENOUS | Status: DC | PRN
  Administered 2014-01-15 (×2): via INTRAVENOUS

## 2014-01-15 MED ORDER — HYDROMORPHONE HCL PF 1 MG/ML IJ SOLN
0.2500 mg | INTRAMUSCULAR | Status: DC | PRN
  Administered 2014-01-15 (×4): 0.5 mg via INTRAVENOUS

## 2014-01-15 MED ORDER — CIPROFLOXACIN IN D5W 400 MG/200ML IV SOLN
INTRAVENOUS | Status: AC
  Filled 2014-01-15: qty 200

## 2014-01-15 MED ORDER — ROCURONIUM BROMIDE 100 MG/10ML IV SOLN
INTRAVENOUS | Status: AC
  Filled 2014-01-15: qty 1

## 2014-01-15 MED ORDER — HYDROMORPHONE HCL PF 1 MG/ML IJ SOLN
1.0000 mg | INTRAMUSCULAR | Status: DC | PRN
  Administered 2014-01-15 (×2): 1 mg via INTRAVENOUS
  Filled 2014-01-15 (×2): qty 1

## 2014-01-15 MED ORDER — KCL IN DEXTROSE-NACL 20-5-0.45 MEQ/L-%-% IV SOLN
INTRAVENOUS | Status: DC
Start: 2014-01-15 — End: 2014-01-16
  Administered 2014-01-15 – 2014-01-16 (×3): via INTRAVENOUS
  Filled 2014-01-15 (×3): qty 1000

## 2014-01-15 MED ORDER — BUPIVACAINE-EPINEPHRINE 0.5% -1:200000 IJ SOLN
INTRAMUSCULAR | Status: DC | PRN
  Administered 2014-01-15: 21 mL

## 2014-01-15 MED ORDER — PROPOFOL 10 MG/ML IV BOLUS
INTRAVENOUS | Status: AC
  Filled 2014-01-15: qty 20

## 2014-01-15 MED ORDER — LISINOPRIL 40 MG PO TABS
40.0000 mg | ORAL_TABLET | Freq: Every day | ORAL | Status: DC
  Administered 2014-01-15: 40 mg via ORAL
  Filled 2014-01-15 (×2): qty 1

## 2014-01-15 MED ORDER — LACTATED RINGERS IV SOLN
INTRAVENOUS | Status: DC | PRN
  Administered 2014-01-15: 1000 mL

## 2014-01-15 MED ORDER — MIDAZOLAM HCL 2 MG/2ML IJ SOLN
INTRAMUSCULAR | Status: AC
  Filled 2014-01-15: qty 2

## 2014-01-15 MED ORDER — PROPOFOL 10 MG/ML IV BOLUS
INTRAVENOUS | Status: DC | PRN
  Administered 2014-01-15 (×2): 50 mg via INTRAVENOUS
  Administered 2014-01-15: 200 mg via INTRAVENOUS

## 2014-01-15 MED ORDER — FENTANYL CITRATE 0.05 MG/ML IJ SOLN
INTRAMUSCULAR | Status: AC
  Filled 2014-01-15: qty 5

## 2014-01-15 MED ORDER — PHENYLEPHRINE HCL 10 MG/ML IJ SOLN
INTRAMUSCULAR | Status: DC | PRN
  Administered 2014-01-15: 80 ug via INTRAVENOUS

## 2014-01-15 MED ORDER — ROCURONIUM BROMIDE 100 MG/10ML IV SOLN
INTRAVENOUS | Status: DC | PRN
  Administered 2014-01-15: 20 mg via INTRAVENOUS
  Administered 2014-01-15: 10 mg via INTRAVENOUS
  Administered 2014-01-15: 50 mg via INTRAVENOUS

## 2014-01-15 MED ORDER — IOHEXOL 300 MG/ML  SOLN
INTRAMUSCULAR | Status: DC | PRN
Start: 2014-01-15 — End: 2014-01-15
  Administered 2014-01-15: 19 mL via INTRAVENOUS

## 2014-01-15 MED ORDER — HYDROCODONE-ACETAMINOPHEN 5-325 MG PO TABS
1.0000 | ORAL_TABLET | ORAL | Status: DC | PRN
  Administered 2014-01-15 – 2014-01-16 (×3): 2 via ORAL
  Filled 2014-01-15 (×3): qty 2

## 2014-01-15 MED ORDER — ONDANSETRON HCL 4 MG/2ML IJ SOLN: 4.0000 mg | Freq: Four times a day (QID) | INTRAMUSCULAR | Status: DC | PRN

## 2014-01-15 MED ORDER — GLYCOPYRROLATE 0.2 MG/ML IJ SOLN
INTRAMUSCULAR | Status: DC | PRN
  Administered 2014-01-15: 0.6 mg via INTRAVENOUS

## 2014-01-15 MED ORDER — HYDROMORPHONE HCL PF 1 MG/ML IJ SOLN
INTRAMUSCULAR | Status: AC
  Filled 2014-01-15: qty 1

## 2014-01-15 MED ORDER — ONDANSETRON HCL 4 MG PO TABS: 4.0000 mg | ORAL_TABLET | Freq: Four times a day (QID) | ORAL | Status: DC | PRN

## 2014-01-15 MED ORDER — ONDANSETRON HCL 4 MG/2ML IJ SOLN
INTRAMUSCULAR | Status: AC
  Filled 2014-01-15: qty 2

## 2014-01-15 MED ORDER — FENTANYL CITRATE 0.05 MG/ML IJ SOLN
INTRAMUSCULAR | Status: DC | PRN
  Administered 2014-01-15: 25 ug via INTRAVENOUS
  Administered 2014-01-15 (×2): 50 ug via INTRAVENOUS
  Administered 2014-01-15: 100 ug via INTRAVENOUS
  Administered 2014-01-15: 25 ug via INTRAVENOUS

## 2014-01-15 MED ORDER — ACETAMINOPHEN 325 MG PO TABS: 650.0000 mg | ORAL_TABLET | ORAL | Status: DC | PRN

## 2014-01-15 MED ORDER — BUPIVACAINE-EPINEPHRINE 0.5% -1:200000 IJ SOLN
INTRAMUSCULAR | Status: AC
  Filled 2014-01-15: qty 1

## 2014-01-15 MED ORDER — LACTATED RINGERS IV SOLN: INTRAVENOUS | Status: DC

## 2014-01-15 MED ORDER — NEOSTIGMINE METHYLSULFATE 1 MG/ML IJ SOLN
INTRAMUSCULAR | Status: DC | PRN
  Administered 2014-01-15: 5 mg via INTRAVENOUS

## 2014-01-15 MED ORDER — NEOSTIGMINE METHYLSULFATE 1 MG/ML IJ SOLN
INTRAMUSCULAR | Status: AC
  Filled 2014-01-15: qty 10

## 2014-01-15 MED ORDER — ALBUTEROL SULFATE (2.5 MG/3ML) 0.083% IN NEBU: 3.0000 mL | INHALATION_SOLUTION | Freq: Four times a day (QID) | RESPIRATORY_TRACT | Status: DC | PRN

## 2014-01-15 MED ORDER — ONDANSETRON HCL 4 MG/2ML IJ SOLN
INTRAMUSCULAR | Status: DC | PRN
  Administered 2014-01-15: 4 mg via INTRAVENOUS

## 2014-01-15 MED ORDER — GLYCOPYRROLATE 0.2 MG/ML IJ SOLN
INTRAMUSCULAR | Status: AC
  Filled 2014-01-15: qty 3

## 2014-01-15 SURGICAL SUPPLY — 39 items
APL SKNCLS STERI-STRIP NONHPOA (GAUZE/BANDAGES/DRESSINGS) ×1
APPLIER CLIP ROT 10 11.4 M/L (STAPLE) ×2
APR CLP MED LRG 11.4X10 (STAPLE) ×1
BAG SPEC RTRVL LRG 6X4 10 (ENDOMECHANICALS) ×1
BENZOIN TINCTURE PRP APPL 2/3 (GAUZE/BANDAGES/DRESSINGS) ×2 IMPLANT
CABLE HIGH FREQUENCY MONO STRZ (ELECTRODE) ×2 IMPLANT
CHLORAPREP W/TINT 26ML (MISCELLANEOUS) ×2 IMPLANT
CLIP APPLIE ROT 10 11.4 M/L (STAPLE) ×1 IMPLANT
COVER MAYO STAND STRL (DRAPES) ×2 IMPLANT
DECANTER SPIKE VIAL GLASS SM (MISCELLANEOUS) ×2 IMPLANT
DRAPE C-ARM 42X120 X-RAY (DRAPES) ×2 IMPLANT
DRAPE LAPAROSCOPIC ABDOMINAL (DRAPES) ×2 IMPLANT
DRAPE UTILITY XL STRL (DRAPES) ×2 IMPLANT
ELECT REM PT RETURN 9FT ADLT (ELECTROSURGICAL) ×2
ELECTRODE REM PT RTRN 9FT ADLT (ELECTROSURGICAL) ×1 IMPLANT
GLOVE SURG ORTHO 8.0 STRL STRW (GLOVE) ×2 IMPLANT
GOWN STRL REUS W/TWL XL LVL3 (GOWN DISPOSABLE) ×4 IMPLANT
HEMOSTAT SURGICEL 4X8 (HEMOSTASIS) IMPLANT
HOVERMATT SINGLE USE (MISCELLANEOUS) ×1 IMPLANT
KIT BASIN OR (CUSTOM PROCEDURE TRAY) ×2 IMPLANT
MANIFOLD NEPTUNE II (INSTRUMENTS) ×1 IMPLANT
NS IRRIG 1000ML POUR BTL (IV SOLUTION) ×2 IMPLANT
POUCH SPECIMEN RETRIEVAL 10MM (ENDOMECHANICALS) ×2 IMPLANT
SCISSORS LAP 5X35 DISP (ENDOMECHANICALS) ×2 IMPLANT
SET CHOLANGIOGRAPH MIX (MISCELLANEOUS) ×2 IMPLANT
SET IRRIG TUBING LAPAROSCOPIC (IRRIGATION / IRRIGATOR) ×2 IMPLANT
SLEEVE XCEL OPT CAN 5 100 (ENDOMECHANICALS) ×2 IMPLANT
SOLUTION ANTI FOG 6CC (MISCELLANEOUS) ×2 IMPLANT
SPONGE GAUZE 4X4 12PLY (GAUZE/BANDAGES/DRESSINGS) ×1 IMPLANT
STRIP CLOSURE SKIN 1/2X4 (GAUZE/BANDAGES/DRESSINGS) ×2 IMPLANT
SUT MNCRL AB 4-0 PS2 18 (SUTURE) ×2 IMPLANT
TAPE CLOTH SURG 4X10 WHT LF (GAUZE/BANDAGES/DRESSINGS) ×1 IMPLANT
TOWEL OR 17X26 10 PK STRL BLUE (TOWEL DISPOSABLE) ×2 IMPLANT
TOWEL OR NON WOVEN STRL DISP B (DISPOSABLE) ×2 IMPLANT
TRAY LAP CHOLE (CUSTOM PROCEDURE TRAY) ×2 IMPLANT
TROCAR BLADELESS OPT 5 100 (ENDOMECHANICALS) ×2 IMPLANT
TROCAR XCEL BLUNT TIP 100MML (ENDOMECHANICALS) ×2 IMPLANT
TROCAR XCEL NON-BLD 11X100MML (ENDOMECHANICALS) ×2 IMPLANT
TUBING INSUFFLATION 10FT LAP (TUBING) ×2 IMPLANT

## 2014-01-15 NOTE — Op Note (Signed)
Procedure Note  Pre-operative Diagnosis:  Symptomatic cholelithiasis, chronic cholecystitis  Post-operative Diagnosis:  same  Surgeon:  Velora Hecklerodd M. Glee Lashomb, MD, FACS  Assistant:  Glenna FellowsBenjamin Hoxworth, MD, FACS   Procedure:  Laparoscopic cholecystectomy with intra-operative cholangiography  Anesthesia:  General  Estimated Blood Loss:  minimal  Drains: none         Specimen: Gallbladder to pathology  Indications:  [name] with symptomatic cholelithiasis and chronic cholecystitis.  Procedure Details:  The patient was seen in the pre-op holding area. The risks, benefits, complications, treatment options, and expected outcomes have been discussed with the patient. The patient agreed with the proposed plan and signed the informed consent form.  The patient was taken to Operating Room, identified as Kevin Pugh and the procedure verified as Laparoscopic Cholecystectomy with Intraoperative Cholangiogram. A "time out" was completed and the above information confirmed.  Following induction of general anesthesia, the patient was placed in the supine position. The abdomen was prepped and draped in the usual aseptic fashion.  An incision was made in the skin below the umbilicus. The midline fascia was incised and the peritoneal cavity entered and the Hasson canula was introduced under direct vision.  The Hasson canula was secured with a 0-Vicryl pursestring suture. Pneumoperitoneum was established with carbon dioxide. Additional trocars were introduced under direct vision along the right costal margin in the midline, mid-clavicular line, and anterior axillary line.   The gallbladder was identified and the fundus grasped and retracted cephalad. Adhesions were taken down bluntly and the electrocautery was utilized as needed, taking care not to injure any adjacent structures. The infundibulum was grasped and retracted laterally, exposing the peritoneum overlying the triangle of Calot. The peritoneum was  incised and structures exposed with blunt dissection. The cystic duct was clearly identified, bluntly dissected circumferentially, and clipped at the neck of the gallbladder.  An incision was made in the cystic duct and the cholangiogram catheter introduced. The catheter was secured using an ligaclip.  Real-time cholangiography was performed using C-arm fluoroscopy.  There was rapid filling of a normal caliber common bile duct.  There was reflux of contrast into the left and right hepatic ductal systems.  There was free flow distally into the duodenum without filling defect or obstruction.  Catheter was removed from the peritoneal cavity.  The cystic duct was then triply ligated with surgical clips and divided. The cystic artery was identified, dissected circumferentially, ligated with ligaclips, and divided.  The gallbladder was dissected away from the liver bed using the electrocautery for hemostasis. The gallbladder was completely removed from the liver and placed into an endocatch bag. The right upper quadrant was irrigated and the gallbladder bed was inspected. Hemostasis was achieved with the electrocautery. Warm saline irrigation was utilized until clear.  Pneumoperitoneum was released after viewing removal of the trocars with good hemostasis noted. The umbilical wound was irrigated and the fascia was then closed with the pursestring suture.  Local anesthetic was infiltrated at all port sites. The skin incisions were closed with 4-0 Monocril subcuticular sutures and steri-strips and dressings were applied.  Instrument, sponge, and needle counts were correct at the conclusion of the case.  The patient was awakened from anesthesia and brought to the recovery room in stable condition.  The patient tolerated the procedure well.   Velora Hecklerodd M. Kynsley Whitehouse, MD, [facility] Office: [phone]

## 2014-01-15 NOTE — Preoperative (Signed)
Beta Blockers   Reason not to administer Beta Blockers:Not Applicable, not on home BB 

## 2014-01-15 NOTE — Transfer of Care (Signed)
Immediate Anesthesia Transfer of Care Note  Patient: Kevin Pugh  Procedure(s) Performed: Procedure(s): LAPAROSCOPIC CHOLECYSTECTOMY WITH INTRAOPERATIVE CHOLANGIOGRAM (N/A)  Patient Location: PACU  Anesthesia Type:General  Level of Consciousness: Patient easily awoken, sedated, comfortable, cooperative, following commands, responds to stimulation.   Airway & Oxygen Therapy: Patient spontaneously breathing, ventilating well, oxygen via simple oxygen mask.  Post-op Assessment: Report given to PACU RN, vital signs reviewed and stable, moving all extremities.   Post vital signs: Reviewed and stable.  Complications: No apparent anesthesia complications

## 2014-01-15 NOTE — H&P (View-Only) (Signed)
General Surgery [facility]  Chief Complaint  Patient presents with  . New Evaluation    symptomatic gallstones - referral from Nils Pyle, FNP    HISTORY: Patient is a 33 year old male referred by his primary care provider for evaluation of right upper quadrant abdominal pain and cholelithiasis. Patient has had intermittent symptoms over the past month. He has had right upper quadrant pain radiating to the back. This has been associated with nausea. He notes fatty food intolerance. He denies jaundice. He denies acholic stools. He denies fevers or chills.  Patient was seen by his primary care provider. Abdominal ultrasound was obtained which shows multiple small gallstones. There was no biliary dilatation. Gallbladder wall was thickened at 4 mm. Liver was enlarged consistent with fatty infiltration. Laboratory studies showed normal liver function tests.  Previous abdominal surgery includes a pediatric hernia repair. Patient also had an episode of hepatitis A infection as a 34-year-old child.  Past Medical History  Diagnosis Date  . Hypertension   . Hyperlipemia   . Asthma   . Chronic back pain   . Bronchitis   . Gout   . Vitamin D deficiency     Current Outpatient Prescriptions  Medication Sig Dispense Refill  . albuterol (PROVENTIL HFA;VENTOLIN HFA) 108 (90 BASE) MCG/ACT inhaler Inhale 2 puffs into the lungs every 6 (six) hours as needed. For shortness of breath       . cetirizine (ZYRTEC) 10 MG tablet Take 10 mg by mouth daily.        . cholecalciferol (VITAMIN D) 1000 UNITS tablet Take 1,000 Units by mouth daily.        . Choline Fenofibrate (TRILIPIX PO) Take 1 tablet by mouth daily.        Marland Kitchen COLCRYS 0.6 MG tablet TAKE 2 TABLETS INITIALLY, THEN TAKE 1 TABLET 1 HOUR LATER, WAIT 12HOURS, THEN TAKE 1 TABLET TWICE DAILY  60 tablet  2  . diclofenac (VOLTAREN) 75 MG EC tablet Take 1 tablet (75 mg total) by mouth 2 (two) times daily.  14 tablet  0  . fish  oil-omega-3 fatty acids 1000 MG capsule Take 1 g by mouth daily.        Marland Kitchen HYDROcodone-acetaminophen (NORCO) 5-325 MG per tablet Take 1 tablet by mouth every 6 (six) hours as needed for moderate pain.  30 tablet  0  . lisinopril (PRINIVIL,ZESTRIL) 40 MG tablet TAKE ONE TABLET BY MOUTH ONE  TIME DAILY  30 tablet  5   No current facility-administered medications for this visit.    Allergies  Allergen Reactions  . Prednisone Anaphylaxis, Hives and Swelling  . Keflex [Cephalexin Monohydrate]     Like thrush on the outside of mouth    Family History  Problem Relation Age of Onset  . Multiple sclerosis Mother   . Diabetes Father   . Hypertension Father   . Hyperlipidemia Father   . Heart disease Father   . Asthma Son   . Asthma Son     History   Social History  . Marital Status: Single    Spouse Name: N/A    Number of Children: N/A  . Years of Education: N/A   Social History Main Topics  . Smoking status: Never Smoker   . Smokeless tobacco: Current User    Types: Snuff     Comment: occasionally  . Alcohol Use: No  . Drug Use: No  . Sexual Activity: None   Other Topics Concern  . None  Social History Narrative  . None    REVIEW OF SYSTEMS - PERTINENT POSITIVES ONLY: Denies jaundice. Denies acholic stools. Intermittent nausea. No emesis. No fever.  EXAMCeasar Mons: Filed Vitals:   12/27/13 0943  BP: 129/87  Pulse: 78  Temp: 99.2 F (37.3 C)  Resp: 18    GENERAL: well-developed, well-nourished, no acute distress; morbidly obese HEENT: normocephalic; pupils equal and reactive; sclerae clear; dentition good; mucous membranes moist NECK:  symmetric on extension; no palpable anterior or posterior cervical lymphadenopathy; no supraclavicular masses; no tenderness CHEST: clear to auscultation bilaterally without rales, rhonchi, or wheezes CARDIAC: regular rate and rhythm without significant murmur; peripheral pulses are full ABDOMEN: soft without distension; bowel sounds  present; no mass; no hepatosplenomegaly; no hernia; mild right upper quadrant tenderness to deep palpation EXT:  non-tender without edema; no deformity NEURO: no gross focal deficits; no sign of tremor   LABORATORY RESULTS: See [facility] ([facility]L-E[facility]) for most recent results  RADIOLOGY RESULTS: See [facility] (CHL-Epic) for most recent results  IMPRESSION: #1 symptomatic cholelithiasis, chronic cholecystitis #2 morbid obesity #3 fatty liver  PLAN: The patient and I discussed the above findings at length. We reviewed his ultrasound results and his laboratory studies. I provided him with written literature regarding cholecystectomy. I have recommended laparoscopic cholecystectomy with intraoperative cholangiography. We have discussed the procedure. We've discussed the hospital stay to be anticipated. We have discussed his recovery and return to activity. He understands and wishes to proceed in the near future.  The risks and benefits of the procedure have been discussed at length with the patient.  The patient understands the proposed procedure, potential alternative treatments, and the course of recovery to be expected.  All of the patient's questions have been answered at this time.  The patient wishes to proceed with surgery.  Velora Hecklerodd M. Hilarie Sinha, MD, FACS General & Endocrine Surgery [facility]  Primary Care Physician: Rudi HeapMOORE, DONALD, MD

## 2014-01-15 NOTE — Interval H&P Note (Signed)
History and Physical Interval Note:  01/15/2014 7:23 AM  Kevin PertJason L Winberry  has presented today for surgery, with the diagnosis of chronic cholecystitis.  The various methods of treatment have been discussed with the patient and family. After consideration of risks, benefits and other options for treatment, the patient has consented to    Procedure(s): LAPAROSCOPIC CHOLECYSTECTOMY WITH INTRAOPERATIVE CHOLANGIOGRAM (N/A) as a surgical intervention .    The patient's history has been reviewed, patient examined, no change in status, stable for surgery.  I have reviewed the patient's chart and labs.  Questions were answered to the patient's satisfaction.    Velora Hecklerodd M. Nera Haworth, MD, [facility] Office: [phone]    Belvie Iribe MJudie Petit

## 2014-01-15 NOTE — Anesthesia Postprocedure Evaluation (Signed)
  Anesthesia Post-op Note  Patient: Kevin Pugh  Procedure(s) Performed: Procedure(s) (LRB): LAPAROSCOPIC CHOLECYSTECTOMY WITH INTRAOPERATIVE CHOLANGIOGRAM (N/A)  Patient Location: PACU  Anesthesia Type: General  Level of Consciousness: awake and alert   Airway and Oxygen Therapy: Patient Spontanous Breathing  Post-op Pain: mild  Post-op Assessment: Post-op Vital signs reviewed, Patient's Cardiovascular Status Stable, Respiratory Function Stable, Patent Airway and No signs of Nausea or vomiting  Last Vitals:  Filed Vitals:   01/15/14 1023  BP: 125/80  Pulse: 91  Temp: 36.5 C  Resp: 15    Post-op Vital Signs: stable   Complications: No apparent anesthesia complications

## 2014-01-16 ENCOUNTER — Encounter (HOSPITAL_COMMUNITY): Payer: Self-pay | Admitting: Surgery

## 2014-01-16 MED ORDER — HYDROCODONE-ACETAMINOPHEN 5-325 MG PO TABS: 1.0000 | ORAL_TABLET | ORAL | Status: DC | PRN

## 2014-01-16 NOTE — Care Management Note (Signed)
    Page 1 of 1   01/16/2014     10:56:51 AM   CARE MANAGEMENT NOTE 01/16/2014  Patient:  Kevin Pugh,Kevin Pugh   Account Number:  [id number]  Date Initiated:  01/16/2014  Documentation initiated by:  Lorenda IshiharaPEELE,Octavia Velador  Subjective/Objective Assessment:   33 yo male admitted s/p lap chole. PTA lived at home with mother.     Action/Plan:   Home when stable   Anticipated DC Date:  01/19/2014   Anticipated DC Plan:  HOME/SELF CARE      DC Planning Services  CM consult      Choice offered to / List presented to:             Status of service:  Completed, signed off Medicare Important Message given?   (If response is "NO", the following Medicare IM given date fields will be blank) Date Medicare IM given:   Date Additional Medicare IM given:    Discharge Disposition:  HOME/SELF CARE  Per UR Regulation:  Reviewed for med. necessity/level of care/duration of stay  If discussed at Long Length of Stay Meetings, dates discussed:    Comments:

## 2014-01-16 NOTE — Progress Notes (Signed)
Placed patient on CPAP on home setting of 10 cmH2O. Filled water chamber with water for humidification. Patient is tolerating well at this time. RT will continue to monitor.

## 2014-01-16 NOTE — Discharge Summary (Signed)
Physician Discharge Summary [facility]  Patient ID: Kevin Pugh MRN: [id number] DOB/AGE: 12-May-1981 33 y.o.  Admit date: 01/15/2014 Discharge date: 01/16/2014  Admission Diagnoses:  Symptomatic cholelithiasis  Discharge Diagnoses:  Principal Problem:   Cholelithiasis with cholecystitis Active Problems:   Cholecystitis with cholelithiasis   Discharged Condition: good  Hospital Course: admitted post op for observation.  Stable overnight.  Tolerated diet.  Ambulated.  Prepared for discharge POD#1.  Used CPAP overnight.  Consults: none  Significant Diagnostic Studies: none  Treatments: surgery: lap chole with IOC  Discharge Exam: Blood pressure 119/80, pulse 62, temperature 97.8 F (36.6 C), temperature source Oral, resp. rate 18, height 6\' 1"  (1.854 m), weight 384 lb 15.8 oz (174.63 kg), SpO2 95.00%. HEENT - clear Neck - soft Chest - clear bilaterally Cor - RRR Abd - dressings dry and intact, soft, non-distended  Disposition: Home with family  Discharge Orders   Future Appointments Provider Department Dept Phone   01/31/2014 10:15 AM Velora Heckler, MD [facility] [place] [phone]   Future Orders Complete By Expires   Diet - low sodium heart healthy  As directed    Discharge instructions  As directed    Comments:     [facility]  LAPAROSCOPIC SURGERY - POST-OP INSTRUCTIONS  Always review your discharge instruction sheet given to you by the facility where your surgery was performed.  A prescription for pain medication may be given to you upon discharge.  Take your pain medication as prescribed.  If narcotic pain medicine is not needed, then you may take acetaminophen (Tylenol) or ibuprofen (Advil) as needed.  Take your usually prescribed medications unless otherwise directed.  If you need a refill on your pain medication, please contact your pharmacy.  They will contact our office to request authorization.  Prescriptions will not be filled after 5 P.M. or on weekends.  You should follow a light diet the first few days after arrival home, such as soup and crackers or toast.  Be sure to include plenty of fluids daily.  Most patients will experience some swelling and bruising in the area of the incisions.  Ice packs will help.  Swelling and bruising can take several days to resolve.   It is common to experience some constipation if taking pain medication after surgery.  Increasing fluid intake and taking a stool softener (such as Colace) will usually help or prevent this problem from occurring.  A mild laxative (Milk of Magnesia or Miralax) should be taken according to package instructions if there are no bowel movements after 48 hours.  Unless discharge instructions indicate otherwise, you may remove your bandages 24-48 hours after surgery, and you may shower at that time.  You may have steri-strips (small skin tapes) in place directly over the incision.  These strips should be left on the skin for 7-10 days.  If your surgeon used skin glue on the incision, you may shower in 24 hours.  The glue will flake off over the next 2-3 weeks.  Any sutures or staples will be removed at the office during your follow-up visit.  ACTIVITIES:  You may resume regular (light) daily activities beginning the next day-such as daily self-care, walking, climbing stairs-gradually increasing activities as tolerated.  You may have sexual intercourse when it is comfortable.  Refrain from any heavy lifting or straining until approved by your doctor.  You may drive when you are no longer taking prescription pain medication, you can comfortably wear  a seatbelt, and you can safely maneuver your car and apply brakes.  You should see your doctor in the office for a follow-up appointment approximately 2-3 weeks after your surgery.  Make sure that you call for this appointment within a day or two after you arrive home to insure a convenient  appointment time.  WHEN TO CALL YOUR DOCTOR: Fever over 101.0 Inability to urinate Continued bleeding from incision Increased pain, redness, or drainage from the incision Increasing abdominal pain  The clinic staff is available to answer your questions during regular business hours.  Please don't hesitate to call and ask to speak to one of the nurses for clinical concerns.  If you have a medical emergency, go to the nearest emergency room or call 911.  [facility] from [facility] is always on call for the hospital.  Velora Hecklerodd M. Tannie Koskela, MD, [facility] Office: [phone] Toll Free:  [phone] FAX [phone]  Web site: [email or web address]   Increase activity slowly  As directed    Remove dressing in 24 hours  As directed        Medication List         albuterol 108 (90 BASE) MCG/ACT inhaler  Commonly known as:  PROVENTIL HFA;VENTOLIN HFA  Inhale 2 puffs into the lungs every 6 (six) hours as needed. For shortness of breath     cetirizine 10 MG tablet  Commonly known as:  ZYRTEC  Take 10 mg by mouth at bedtime.     cholecalciferol 1000 UNITS tablet  Commonly known as:  VITAMIN D  Take 1,000 Units by mouth daily.     diclofenac 75 MG EC tablet  Commonly known as:  VOLTAREN  Take 75 mg by mouth 2 (two) times daily. FOR LOWER BACK PAIN     fish oil-omega-3 fatty acids 1000 MG capsule  Take 1 g by mouth daily.     HYDROcodone-acetaminophen 5-325 MG per tablet  Commonly known as:  NORCO/VICODIN  Take 1-2 tablets by mouth every 4 (four) hours as needed for moderate pain.     lisinopril 40 MG tablet  Commonly known as:  PRINIVIL,ZESTRIL  Take 40 mg by mouth at bedtime.           Follow-up Information   Follow up with Velora HecklerGERKIN,Mainor Hellmann M, MD. Schedule an appointment as soon as possible for a visit in 2 weeks. (For wound re-check)    Specialty:  General Surgery   Contact information:   [address] [place] [address] [phone]       Velora Hecklerodd M. Dequante Tremaine, MD, [facility] Office: [phone]   Signed: Velora HecklerGERKIN,Bayli Quesinberry M 01/16/2014, 8:00 AM

## 2014-01-22 ENCOUNTER — Telehealth (INDEPENDENT_AMBULATORY_CARE_PROVIDER_SITE_OTHER): Payer: Self-pay | Admitting: General Surgery

## 2014-01-22 NOTE — Telephone Encounter (Signed)
LMOM that his Rx is ready to pick up now at the front desk.

## 2014-01-22 NOTE — Telephone Encounter (Signed)
Pt of Dr. Gerrit FriendsGerkin had lap chole on 01/15/14, with post op appt on 01/31/14.  He is calling for his first refill of pain medication.  Will ask Dr. Johna SheriffHoxworth, urgent office, to sign Rx for Norco 5/325 mg, # 30 (thirty,) 1-2 po Q4-6H prn pain, no refill, and notify him to pick up Rx at the front desk.

## 2014-01-25 ENCOUNTER — Encounter (INDEPENDENT_AMBULATORY_CARE_PROVIDER_SITE_OTHER): Payer: Self-pay

## 2014-01-25 ENCOUNTER — Telehealth (INDEPENDENT_AMBULATORY_CARE_PROVIDER_SITE_OTHER): Payer: Self-pay

## 2014-01-25 NOTE — Telephone Encounter (Signed)
Pt is post op lap cholecystectomy on 01/15/14. Pt requesting RTW note dated today to return to work tonight.  Letter written with restrictions of no lifting, pushing or pulling anything over 15lbs for two weeks from today.  Pt will p/u letter tomorrow.

## 2014-01-31 ENCOUNTER — Encounter (INDEPENDENT_AMBULATORY_CARE_PROVIDER_SITE_OTHER): Payer: Self-pay | Admitting: Surgery

## 2014-01-31 ENCOUNTER — Ambulatory Visit (INDEPENDENT_AMBULATORY_CARE_PROVIDER_SITE_OTHER): Payer: Medicaid Other | Admitting: Surgery

## 2014-01-31 VITALS — BP 148/78 | HR 86 | Temp 97.4°F | Resp 22 | Ht 73.0 in | Wt 389.0 lb

## 2014-01-31 DIAGNOSIS — K8010 Calculus of gallbladder with chronic cholecystitis without obstruction: Secondary | ICD-10-CM

## 2014-01-31 NOTE — Patient Instructions (Signed)
  COCOA BUTTER & VITAMIN E CREAM  (Palmer's or other brand)  Apply cocoa butter/vitamin E cream to your incision 2 - 3 times daily.  Massage cream into incision for one minute with each application.  Use sunscreen (50 SPF or higher) for first 6 months after surgery if area is exposed to sun.  You may substitute Mederma or other scar reducing creams as desired.   

## 2014-01-31 NOTE — Progress Notes (Signed)
General Surgery [facility]  Chief Complaint  Patient presents with  . Routine Post Op    lap chole 01/15/2014    HISTORY: Patient is a 33 year old male who underwent laparoscopic cholecystectomy with intraoperative cholangiography on 01/15/2014. Postoperative course has been uneventful. He has returned to work. He is tolerating a regular diet. He is having minimal pain.  EXAM: Abdominal incisions are healed nicely. No sign of infection. No sign of herniation. Right upper quadrant is soft and nontender without mass.  IMPRESSION: Status post laparoscopic cholecystectomy for chronic cholecystitis and cholelithiasis  PLAN: Patient will recurred and normal activity without restriction. I've asked him to avoid any strenuous lifting for the next few weeks. He will begin applying topical creams to his incisions.  He will return for surgical care as needed.  Velora Hecklerodd M. Anaaya Fuster, MD, FACS General & Endocrine Surgery [facility]   Visit Diagnoses: 1. Cholelithiasis with cholecystitis

## 2014-02-26 ENCOUNTER — Telehealth: Payer: Self-pay | Admitting: Family Medicine

## 2014-02-26 NOTE — Telephone Encounter (Signed)
appt with bill in the am

## 2014-02-27 ENCOUNTER — Encounter: Payer: Self-pay | Admitting: Family Medicine

## 2014-02-27 ENCOUNTER — Ambulatory Visit (INDEPENDENT_AMBULATORY_CARE_PROVIDER_SITE_OTHER): Payer: Medicaid Other | Admitting: Family Medicine

## 2014-02-27 VITALS — BP 139/86 | HR 88 | Temp 97.6°F | Ht 73.0 in | Wt 388.0 lb

## 2014-02-27 DIAGNOSIS — J209 Acute bronchitis, unspecified: Secondary | ICD-10-CM

## 2014-02-27 MED ORDER — AZITHROMYCIN 250 MG PO TABS: ORAL_TABLET | ORAL | Status: DC

## 2014-02-27 MED ORDER — HYDROCODONE-HOMATROPINE 5-1.5 MG/5ML PO SYRP: 5.0000 mL | ORAL_SOLUTION | Freq: Three times a day (TID) | ORAL | Status: DC | PRN

## 2014-02-27 NOTE — Progress Notes (Signed)
   Subjective:    Patient ID: Kevin Pugh, male    DOB: June 05, 1981, 33 y.o.   MRN: [id number]  HPI This 33 y.o. male presents for evaluation of persistent cough and uri sx's.   Review of Systems C/o uri sx's No chest pain, SOB, HA, dizziness, vision change, N/V, diarrhea, constipation, dysuria, urinary urgency or frequency, myalgias, arthralgias or rash.     Objective:   Physical Exam  Vital signs noted  Well developed well nourished male.  HEENT - Head atraumatic Normocephalic                Eyes - PERRLA, Conjuctiva - clear Sclera- Clear EOMI                Ears - EAC's Wnl TM's Wnl Gross Hearing WNL                Throat - oropharanx wnl Respiratory - Lungs diminished bilateral Cardiac - RRR S1 and S2 without murmur GI - Abdomen soft Nontender and bowel sounds active x 4 Extremities - No edema. Neuro - Grossly intact.      Assessment & Plan:  Acute bronchitis - Plan: azithromycin (ZITHROMAX) 250 MG tablet, HYDROcodone-homatropine (HYCODAN) 5-1.5 MG/5ML syrup Advair diskus 250/50 one puff bid #1 sample  Deatra CanterWilliam J Zulema Pulaski FNP

## 2014-05-17 IMAGING — RF DG CHOLANGIOGRAM OPERATIVE
1 series · 4 of 4 positions shown · non-contrast
Comparison: None.

CLINICAL DATA: Cholelithiasis

EXAM:
INTRAOPERATIVE CHOLANGIOGRAM
TECHNIQUE: Cholangiographic images from the C-arm fluoroscopic device were
submitted for interpretation post-operatively. Please see the
procedural report for the amount of contrast and the fluoroscopy
time utilized.

[Series 1: run · 4 of 78 frames shown]
[frame 12/78]
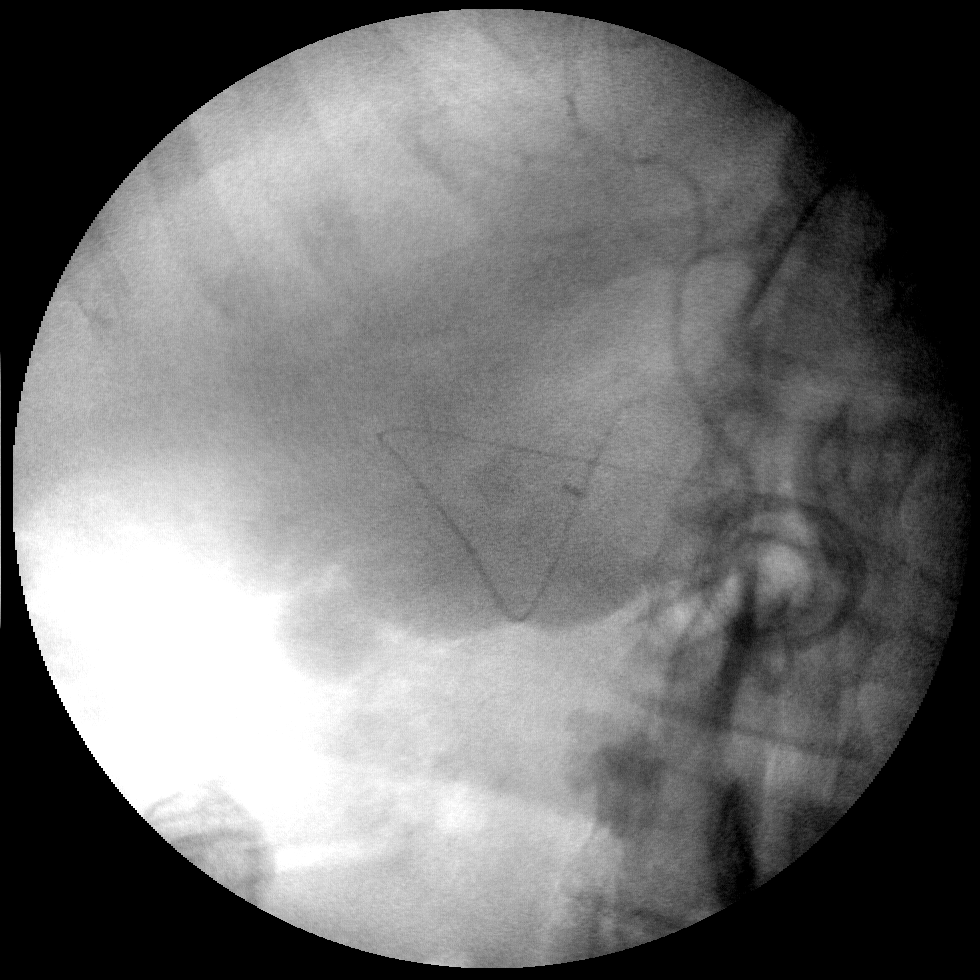
[frame 40/78]
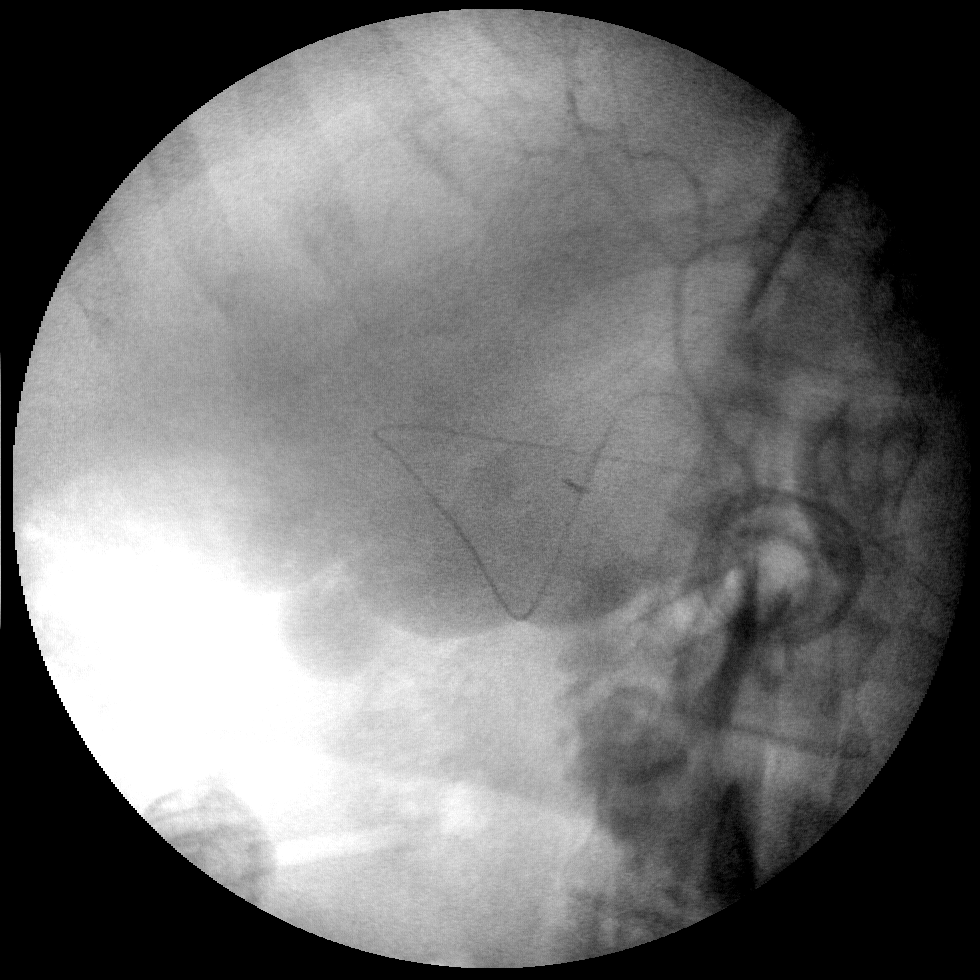
[frame 57/78]
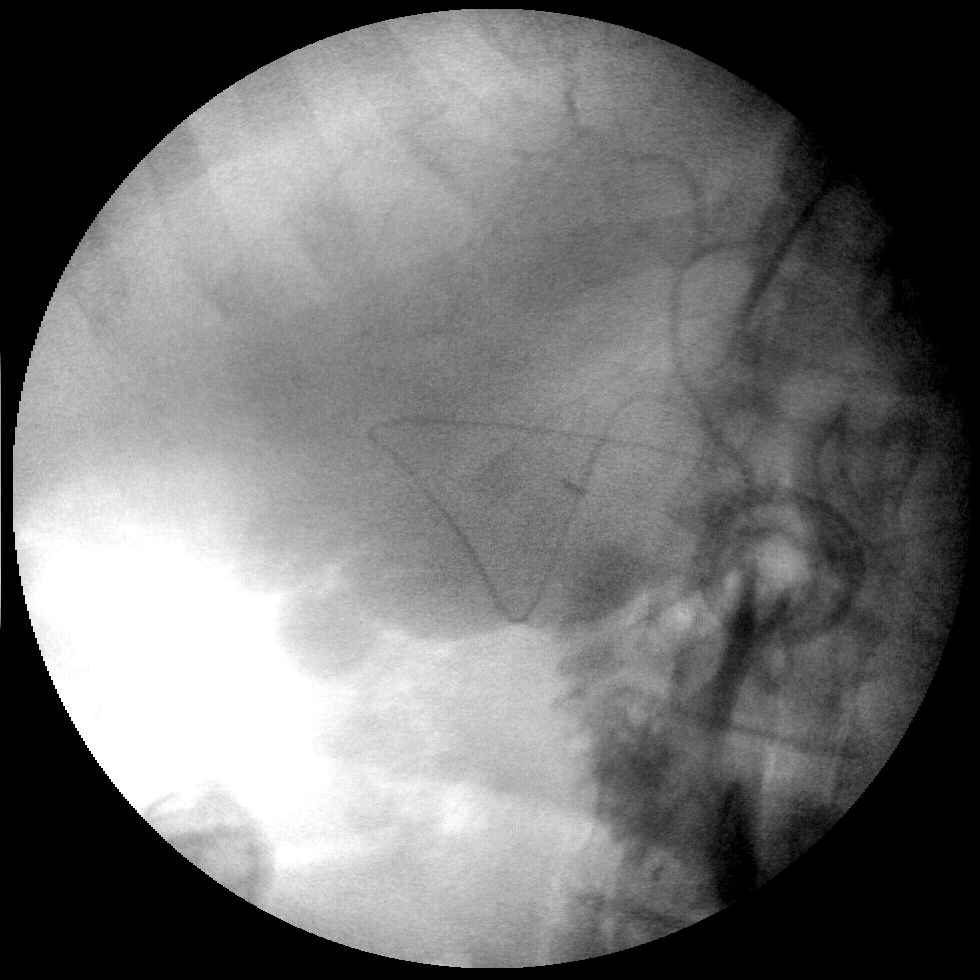
[frame 67/78]
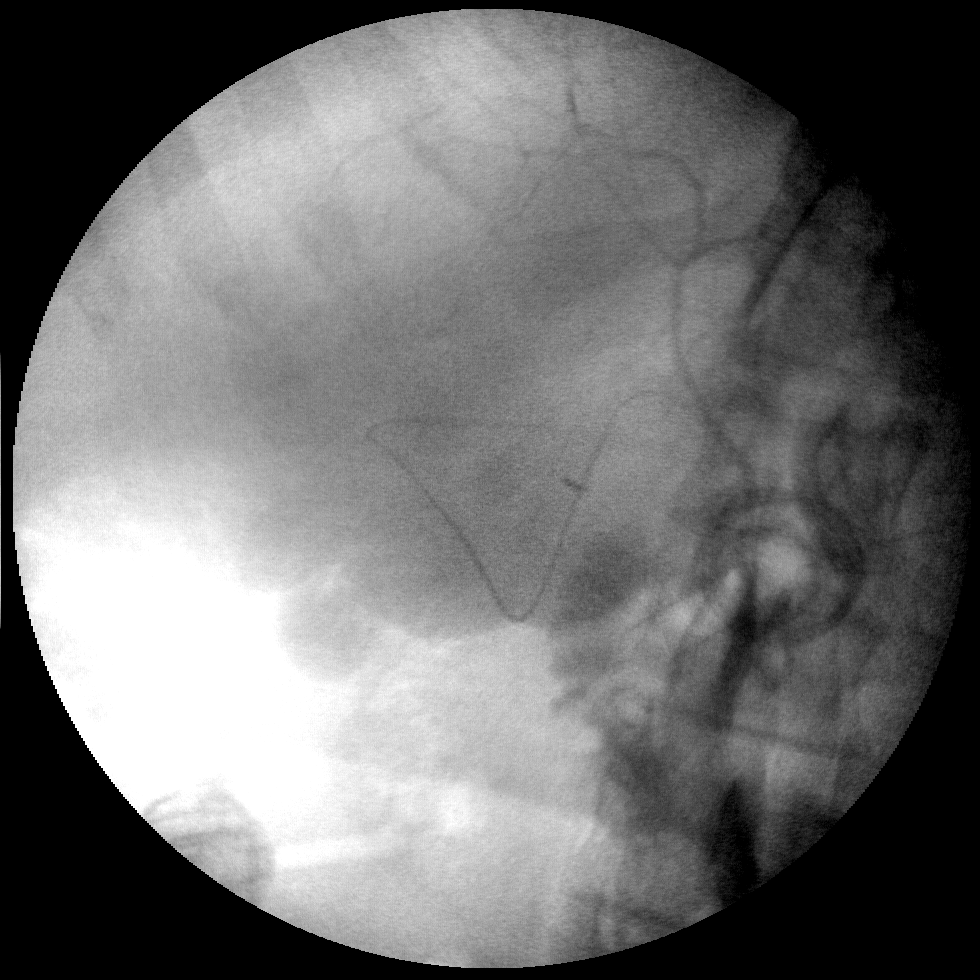

[4 of 4 positions shown; findings below may reference images not displayed]

FINDINGS: Contrast fills the biliary tree and duodenum compatible with
patency. There are no filling defects in the common bile duct to
suggest common duct stones.
IMPRESSION: Patent biliary tree without evidence of common bile duct stones.

## 2014-07-05 ENCOUNTER — Other Ambulatory Visit: Payer: Self-pay | Admitting: Family Medicine

## 2014-07-07 ENCOUNTER — Telehealth: Payer: Self-pay | Admitting: Family Medicine

## 2014-07-09 ENCOUNTER — Other Ambulatory Visit: Payer: Self-pay | Admitting: Family Medicine

## 2014-07-09 MED ORDER — LISINOPRIL 40 MG PO TABS: 40.0000 mg | ORAL_TABLET | Freq: Every day | ORAL | Status: DC

## 2014-07-09 NOTE — Telephone Encounter (Signed)
done

## 2014-08-17 ENCOUNTER — Ambulatory Visit (INDEPENDENT_AMBULATORY_CARE_PROVIDER_SITE_OTHER): Payer: Medicaid Other | Admitting: Family

## 2014-08-17 ENCOUNTER — Encounter: Payer: Self-pay | Admitting: Family

## 2014-08-17 VITALS — BP 146/97 | HR 98 | Temp 98.5°F | Ht 73.0 in | Wt 383.6 lb

## 2014-08-17 DIAGNOSIS — R05 Cough: Secondary | ICD-10-CM

## 2014-08-17 DIAGNOSIS — R059 Cough, unspecified: Secondary | ICD-10-CM

## 2014-08-17 DIAGNOSIS — J069 Acute upper respiratory infection, unspecified: Secondary | ICD-10-CM

## 2014-08-17 MED ORDER — BENZONATATE 200 MG PO CAPS: 200.0000 mg | ORAL_CAPSULE | Freq: Three times a day (TID) | ORAL | Status: DC | PRN

## 2014-08-17 MED ORDER — HYDROCODONE-HOMATROPINE 5-1.5 MG/5ML PO SYRP: 5.0000 mL | ORAL_SOLUTION | Freq: Three times a day (TID) | ORAL | Status: DC | PRN

## 2014-08-17 MED ORDER — AZITHROMYCIN 250 MG PO TABS: ORAL_TABLET | ORAL | Status: DC

## 2014-08-17 NOTE — Patient Instructions (Signed)
Upper Respiratory Infection, Adult An upper respiratory infection (URI) is also known as the common cold. It is often caused by a type of germ (virus). Colds are easily spread (contagious). You can pass it to others by kissing, coughing, sneezing, or drinking out of the same glass. Usually, you get better in 1 or 2 weeks.  HOME CARE   Only take medicine as told by your doctor.  Use a warm mist humidifier or breathe in steam from a hot shower.  Drink enough water and fluids to keep your pee (urine) clear or pale yellow.  Get plenty of rest.  Return to work when your temperature is back to normal or as told by your doctor. You may use a face mask and wash your hands to stop your cold from spreading. GET HELP RIGHT AWAY IF:   After the first few days, you feel you are getting worse.  You have questions about your medicine.  You have chills, shortness of breath, or brown or red spit (mucus).  You have yellow or brown snot (nasal discharge) or pain in the face, especially when you bend forward.  You have a fever, puffy (swollen) neck, pain when you swallow, or white spots in the back of your throat.  You have a bad headache, ear pain, sinus pain, or chest pain.  You have a high-pitched whistling sound when you breathe in and out (wheezing).  You have a lasting cough or cough up blood.  You have sore muscles or a stiff neck. MAKE SURE YOU:   Understand these instructions.  Will watch your condition.  Will get help right away if you are not doing well or get worse. Document Released: 04/13/2008 Document Revised: 01/18/2012 Document Reviewed: 01/31/2014 Exit[employer] Patient Information 2015 [employer]. This information is not intended to replace advice given to you by your health care provider. Make sure you discuss any questions you have with your health care provider.  \ - Take meds as prescribed - Use a cool mist humidifier  -Use saline nose sprays frequently -Saline  irrigations of the nose can be very helpful if done frequently.  * 4X daily for 1 week*  * Use of a nettie pot can be helpful with this. Follow directions with this* -Force fluids -For any cough or congestion  Use plain Mucinex- regular strength or max strength is fine   * Children- consult with Pharmacist for dosing -For fever or aces or pains- take tylenol or ibuprofen appropriate for age and weight.  * for fevers greater than 101 orally you may alternate ibuprofen and tylenol every  3 hours. -Throat lozenges if help   Jaquesha Boroff, FNP   

## 2014-08-17 NOTE — Progress Notes (Signed)
Subjective:    Patient ID: Kevin Pugh, male    DOB: 09/09/1981, 33 y.o.   MRN: [id number]  Cough This is a new problem. The current episode started in the past 7 days ("Last Sunday"). The problem has been unchanged. The problem occurs constantly. The cough is productive of purulent sputum. Associated symptoms include headaches, shortness of breath and wheezing. Pertinent negatives include no chills, ear congestion, ear pain, fever, hemoptysis, myalgias, nasal congestion or rhinorrhea. The symptoms are aggravated by lying down. He has tried OTC cough suppressant (Tessalon pearls) for the symptoms. The treatment provided mild relief. His past medical history is significant for asthma. There is no history of COPD.      Review of Systems  Constitutional: Negative.  Negative for fever and chills.  HENT: Negative for ear pain and rhinorrhea.   Respiratory: Positive for cough, shortness of breath and wheezing. Negative for hemoptysis.   Cardiovascular: Negative.   Gastrointestinal: Negative.   Endocrine: Negative.   Genitourinary: Negative.   Musculoskeletal: Negative.  Negative for myalgias.  Neurological: Positive for headaches.  Hematological: Negative.   Psychiatric/Behavioral: Negative.   All other systems reviewed and are negative.      Objective:   Physical Exam  Vitals reviewed. Constitutional: He is oriented to person, place, and time. He appears well-developed and well-nourished. No distress.  HENT:  Head: Normocephalic.  Right Ear: External ear normal.  Left Ear: External ear normal.  Nasal passage erythemas with mild swelling Oropharynx erythemas   Eyes: Pupils are equal, round, and reactive to light. Right eye exhibits no discharge. Left eye exhibits no discharge.  Neck: Normal range of motion. Neck supple. No thyromegaly present.  Cardiovascular: Normal rate, regular rhythm, normal heart sounds and intact distal pulses.   No murmur heard. Pulmonary/Chest: Effort  normal and breath sounds normal. No respiratory distress. He has no wheezes.  Abdominal: Soft. Bowel sounds are normal. He exhibits no distension. There is no tenderness.  Musculoskeletal: Normal range of motion. He exhibits no edema and no tenderness.  Neurological: He is alert and oriented to person, place, and time. He has normal reflexes. No cranial nerve deficit.  Skin: Skin is warm and dry. No rash noted. No erythema.  Psychiatric: He has a normal mood and affect. His behavior is normal. Judgment and thought content normal.    BP 146/97  Pulse 98  Temp(Src) 98.5 F (36.9 C) (Oral)  Ht 6\' 1"  (1.854 m)  Wt 383 lb 9.6 oz (174 kg)  BMI 50.62 kg/m2       Assessment & Plan:  1. URI (upper respiratory infection) -- Take meds as prescribed - Use a cool mist humidifier  -Use saline nose sprays frequently -Saline irrigations of the nose can be very helpful if done frequently.  * 4X daily for 1 week*  * Use of a nettie pot can be helpful with this. Follow directions with this* -Force fluids -For any cough or congestion  Use plain Mucinex- regular strength or max strength is fine   * Children- consult with Pharmacist for dosing -For fever or aces or pains- take tylenol or ibuprofen appropriate for age and weight.  * for fevers greater than 101 orally you may alternate ibuprofen and tylenol every  3 hours. -Throat lozenges if help - benzonatate (TESSALON) 200 MG capsule; Take 1 capsule (200 mg total) by mouth 3 (three) times daily as needed.  Dispense: 30 capsule; Refill: 1 - azithromycin (ZITHROMAX) 250 MG tablet; Take 500mg  today, then  250 mg for 4 days  Dispense: 6 tablet; Refill: 0 - HYDROcodone-homatropine (HYCODAN) 5-1.5 MG/5ML syrup; Take 5 mLs by mouth every 8 (eight) hours as needed for cough.  Dispense: 120 mL; Refill: 0  2. Cough - benzonatate (TESSALON) 200 MG capsule; Take 1 capsule (200 mg total) by mouth 3 (three) times daily as needed.  Dispense: 30 capsule; Refill:  1 - HYDROcodone-homatropine (HYCODAN) 5-1.5 MG/5ML syrup; Take 5 mLs by mouth every 8 (eight) hours as needed for cough.  Dispense: 120 mL; Refill: 0  Jannifer Rodneyhristy Jacayla Nordell, FNP

## 2014-10-08 ENCOUNTER — Encounter: Payer: Self-pay | Admitting: Family Medicine

## 2014-10-08 ENCOUNTER — Ambulatory Visit (INDEPENDENT_AMBULATORY_CARE_PROVIDER_SITE_OTHER): Payer: Medicaid Other | Admitting: Family Medicine

## 2014-10-08 VITALS — BP 148/108 | HR 116 | Temp 97.6°F | Ht 73.0 in | Wt 382.4 lb

## 2014-10-08 DIAGNOSIS — R05 Cough: Secondary | ICD-10-CM

## 2014-10-08 DIAGNOSIS — M1000 Idiopathic gout, unspecified site: Secondary | ICD-10-CM

## 2014-10-08 DIAGNOSIS — J069 Acute upper respiratory infection, unspecified: Secondary | ICD-10-CM

## 2014-10-08 DIAGNOSIS — R059 Cough, unspecified: Secondary | ICD-10-CM

## 2014-10-08 MED ORDER — HYDROCODONE-ACETAMINOPHEN 5-325 MG PO TABS: 1.0000 | ORAL_TABLET | Freq: Four times a day (QID) | ORAL | Status: DC | PRN

## 2014-10-08 MED ORDER — ALLOPURINOL 100 MG PO TABS: ORAL_TABLET | ORAL | Status: DC

## 2014-10-08 MED ORDER — COLCHICINE 0.6 MG PO TABS
0.6000 mg | ORAL_TABLET | Freq: Every day | ORAL | Status: DC
Start: 2014-10-08 — End: 2015-10-30

## 2014-10-08 MED ORDER — ALLOPURINOL 300 MG PO TABS: ORAL_TABLET | ORAL | Status: DC

## 2014-10-08 MED ORDER — DICLOFENAC SODIUM 75 MG PO TBEC: 75.0000 mg | DELAYED_RELEASE_TABLET | Freq: Two times a day (BID) | ORAL | Status: AC

## 2014-10-08 NOTE — Progress Notes (Signed)
   Subjective:    Patient ID: Kevin Pugh, male    DOB: 1981-08-25, 33 y.o.   MRN: [id number]  HPI Patient is here for c/o severe left knee pain.  He has hx of gout.  Review of Systems  Constitutional: Negative for fever.  HENT: Negative for ear pain.   Eyes: Negative for discharge.  Respiratory: Negative for cough.   Cardiovascular: Negative for chest pain.  Gastrointestinal: Negative for abdominal distention.  Endocrine: Negative for polyuria.  Genitourinary: Negative for difficulty urinating.  Musculoskeletal: Negative for gait problem and neck pain.  Skin: Negative for color change and rash.  Neurological: Negative for speech difficulty and headaches.  Psychiatric/Behavioral: Negative for agitation.       Objective:    BP 148/108 mmHg  Pulse 116  Temp(Src) 97.6 F (36.4 C) (Oral)  Ht 6\' 1"  (1.854 m)  Wt 382 lb 6.4 oz (173.456 kg)  BMI 50.46 kg/m2 Physical Exam  Constitutional: He is oriented to person, place, and time. He appears well-developed and well-nourished.  HENT:  Head: Normocephalic and atraumatic.  Mouth/Throat: Oropharynx is clear and moist.  Eyes: Pupils are equal, round, and reactive to light.  Neck: Normal range of motion. Neck supple.  Cardiovascular: Normal rate and regular rhythm.   No murmur heard. Pulmonary/Chest: Effort normal and breath sounds normal.  Abdominal: Soft. Bowel sounds are normal. There is no tenderness.  Neurological: He is alert and oriented to person, place, and time.  Skin: Skin is warm and dry.  Psychiatric: He has a normal mood and affect.          Assessment & Plan:     ICD-9-CM ICD-10-CM   1. URI (upper respiratory infection) 465.9 J06.9   2. Cough 786.2 R05   3. Acute idiopathic gout, unspecified site 274.01 M10.00 colchicine 0.6 MG tablet     allopurinol (ZYLOPRIM) 100 MG tablet     allopurinol (ZYLOPRIM) 300 MG tablet     diclofenac (VOLTAREN) 75 MG EC tablet     HYDROcodone-acetaminophen (NORCO) 5-325 MG  per tablet     No Follow-up on file.  Deatra CanterWilliam J Edrick Whitehorn FNP

## 2014-11-21 ENCOUNTER — Encounter: Payer: Self-pay | Admitting: Family Medicine

## 2014-11-21 ENCOUNTER — Ambulatory Visit (INDEPENDENT_AMBULATORY_CARE_PROVIDER_SITE_OTHER): Payer: Medicaid Other | Admitting: Family Medicine

## 2014-11-21 VITALS — BP 158/102 | HR 115 | Temp 98.3°F | Ht 73.0 in | Wt 389.0 lb

## 2014-11-21 DIAGNOSIS — R05 Cough: Secondary | ICD-10-CM

## 2014-11-21 DIAGNOSIS — R059 Cough, unspecified: Secondary | ICD-10-CM

## 2014-11-21 DIAGNOSIS — M1000 Idiopathic gout, unspecified site: Secondary | ICD-10-CM

## 2014-11-21 DIAGNOSIS — J069 Acute upper respiratory infection, unspecified: Secondary | ICD-10-CM

## 2014-11-21 MED ORDER — AZITHROMYCIN 250 MG PO TABS: ORAL_TABLET | ORAL | Status: DC

## 2014-11-21 MED ORDER — HYDROCODONE-HOMATROPINE 5-1.5 MG/5ML PO SYRP: 5.0000 mL | ORAL_SOLUTION | Freq: Three times a day (TID) | ORAL | Status: DC | PRN

## 2014-11-21 NOTE — Progress Notes (Signed)
   Subjective:    Patient ID: Kevin Pugh, male    DOB: May 02, 1981, 34 y.o.   MRN: [id number]  HPI C/o cough and uri sx's.  Review of Systems  Constitutional: Negative for fever.  HENT: Negative for ear pain.   Eyes: Negative for discharge.  Respiratory: Negative for cough.   Cardiovascular: Negative for chest pain.  Gastrointestinal: Negative for abdominal distention.  Endocrine: Negative for polyuria.  Genitourinary: Negative for difficulty urinating.  Musculoskeletal: Negative for gait problem and neck pain.  Skin: Negative for color change and rash.  Neurological: Negative for speech difficulty and headaches.  Psychiatric/Behavioral: Negative for agitation.       Objective:    BP 158/102 mmHg  Pulse 115  Temp(Src) 98.3 F (36.8 C) (Oral)  Ht 6\' 1"  (1.854 m)  Wt 389 lb (176.449 kg)  BMI 51.33 kg/m2 Physical Exam  Constitutional: He is oriented to person, place, and time. He appears well-developed and well-nourished.  HENT:  Head: Normocephalic and atraumatic.  Mouth/Throat: Oropharynx is clear and moist.  Eyes: Pupils are equal, round, and reactive to light.  Neck: Normal range of motion. Neck supple.  Cardiovascular: Normal rate and regular rhythm.   No murmur heard. Pulmonary/Chest: Effort normal and breath sounds normal.  Abdominal: Soft. Bowel sounds are normal. There is no tenderness.  Neurological: He is alert and oriented to person, place, and time.  Skin: Skin is warm and dry.  Psychiatric: He has a normal mood and affect.          Assessment & Plan:     ICD-9-CM ICD-10-CM   1. Acute idiopathic gout, unspecified site 274.01 M10.00   2. URI (upper respiratory infection) 465.9 J06.9 azithromycin (ZITHROMAX) 250 MG tablet     HYDROcodone-homatropine (HYCODAN) 5-1.5 MG/5ML syrup  3. Cough 786.2 R05 azithromycin (ZITHROMAX) 250 MG tablet     HYDROcodone-homatropine (HYCODAN) 5-1.5 MG/5ML syrup     No Follow-up on file.  Deatra CanterWilliam J Oxford FNP

## 2015-02-11 ENCOUNTER — Other Ambulatory Visit: Payer: Self-pay | Admitting: Family Medicine

## 2015-02-12 NOTE — Telephone Encounter (Signed)
Appt made for 4-15.

## 2015-02-12 NOTE — Telephone Encounter (Signed)
Only seen Kevin Pugh and Kevin Pugh, BP is not controlled

## 2015-02-12 NOTE — Telephone Encounter (Signed)
Patient NTBS for follow up and lab work  

## 2015-02-22 ENCOUNTER — Ambulatory Visit (INDEPENDENT_AMBULATORY_CARE_PROVIDER_SITE_OTHER): Payer: Medicaid Other | Admitting: Nurse Practitioner

## 2015-02-22 ENCOUNTER — Encounter: Payer: Self-pay | Admitting: Nurse Practitioner

## 2015-02-22 VITALS — BP 146/96 | HR 115 | Temp 97.0°F | Ht 73.0 in | Wt 395.0 lb

## 2015-02-22 DIAGNOSIS — I10 Essential (primary) hypertension: Secondary | ICD-10-CM | POA: Diagnosis not present

## 2015-02-22 DIAGNOSIS — G4730 Sleep apnea, unspecified: Secondary | ICD-10-CM

## 2015-02-22 DIAGNOSIS — I152 Hypertension secondary to endocrine disorders: Secondary | ICD-10-CM | POA: Insufficient documentation

## 2015-02-22 DIAGNOSIS — M109 Gout, unspecified: Secondary | ICD-10-CM

## 2015-02-22 DIAGNOSIS — M10062 Idiopathic gout, left knee: Secondary | ICD-10-CM

## 2015-02-22 MED ORDER — LISINOPRIL-HYDROCHLOROTHIAZIDE 20-12.5 MG PO TABS
1.0000 | ORAL_TABLET | Freq: Every day | ORAL | Status: DC
Start: 2015-02-22 — End: 2015-11-06

## 2015-02-22 MED ORDER — ALLOPURINOL 300 MG PO TABS: 300.0000 mg | ORAL_TABLET | Freq: Every day | ORAL | Status: DC

## 2015-02-22 NOTE — Patient Instructions (Signed)
Exercise to Lose Weight Exercise and a healthy diet may help you lose weight. Your doctor may suggest specific exercises. EXERCISE IDEAS AND TIPS  Choose low-cost things you enjoy doing, such as walking, bicycling, or exercising to workout videos.  Take stairs instead of the elevator.  Walk during your lunch break.  Park your car further away from work or school.  Go to a gym or an exercise class.  Start with 5 to 10 minutes of exercise each day. Build up to 30 minutes of exercise 4 to 6 days a week.  Wear shoes with good support and comfortable clothes.  Stretch before and after working out.  Work out until you breathe harder and your heart beats faster.  Drink extra water when you exercise.  Do not do so much that you hurt yourself, feel dizzy, or get very short of breath. Exercises that burn about 150 calories:  Running 1  miles in 15 minutes.  Playing volleyball for 45 to 60 minutes.  Washing and waxing a car for 45 to 60 minutes.  Playing touch football for 45 minutes.  Walking 1  miles in 35 minutes.  Pushing a stroller 1  miles in 30 minutes.  Playing basketball for 30 minutes.  Raking leaves for 30 minutes.  Bicycling 5 miles in 30 minutes.  Walking 2 miles in 30 minutes.  Dancing for 30 minutes.  Shoveling snow for 15 minutes.  Swimming laps for 20 minutes.  Walking up stairs for 15 minutes.  Bicycling 4 miles in 15 minutes.  Gardening for 30 to 45 minutes.  Jumping rope for 15 minutes.  Washing windows or floors for 45 to 60 minutes. Document Released: 11/28/2010 Document Revised: 01/18/2012 Document Reviewed: 11/28/2010 [employer] Patient Information 2015 [employer]. This information is not intended to replace advice given to you by your health care provider. Make sure you discuss any questions you have with your health care provider.  

## 2015-02-22 NOTE — Progress Notes (Addendum)
   Subjective:    Patient ID: Kevin Pugh, male    DOB: [birth year], 34 y.o.   MRN: [id number]   Patient here today for follow up of chronic medical problems.  Hypertension This is a chronic problem. The current episode started more than 1 year ago. The problem is unchanged. The problem is controlled. Pertinent negatives include no chest pain, headaches, neck pain, palpitations or sweats. There are no associated agents to hypertension. Risk factors for coronary artery disease include male gender, obesity and sedentary lifestyle. Past treatments include ACE inhibitors. The current treatment provides moderate improvement. Compliance problems include diet and exercise.   left knee pain strated yesterday- rates pain 7/10- has had gout in it many tomes and feels the same. No edema. Took colchicine yesterday and has helped a little. Sleep apnea Wears cpap nightly- needs new supplies    Review of Systems  Constitutional: Negative.   HENT: Negative.   Respiratory: Negative.   Cardiovascular: Negative for chest pain and palpitations.  Genitourinary: Negative.   Musculoskeletal: Negative for neck pain.  Neurological: Negative for headaches.  Psychiatric/Behavioral: Negative.   All other systems reviewed and are negative.      Objective:   Physical Exam  Constitutional: He is oriented to person, place, and time. He appears well-developed and well-nourished.  HENT:  Head: Normocephalic.  Right Ear: External ear normal.  Left Ear: External ear normal.  Nose: Nose normal.  Mouth/Throat: Oropharynx is clear and moist.  Eyes: EOM are normal. Pupils are equal, round, and reactive to light.  Neck: Normal range of motion. Neck supple. No JVD present. No thyromegaly present.  Cardiovascular: Normal rate, regular rhythm, normal heart sounds and intact distal pulses.  Exam reveals no gallop and no friction rub.   No murmur heard. Pulmonary/Chest: Effort normal and breath sounds normal. No  respiratory distress. He has no wheezes. He has no rales. He exhibits no tenderness.  Abdominal: Soft. Bowel sounds are normal. He exhibits no mass. There is no tenderness.  Genitourinary: Prostate normal and penis normal.  Musculoskeletal: Normal range of motion. He exhibits no edema.  Lymphadenopathy:    He has no cervical adenopathy.  Neurological: He is alert and oriented to person, place, and time. No cranial nerve deficit.  Skin: Skin is warm and dry.  Psychiatric: He has a normal mood and affect. His behavior is normal. Judgment and thought content normal.    BP 146/96 mmHg  Pulse 115  Temp(Src) 97 F (36.1 C) (Oral)  Ht $R'6\' 1"'Kg$  (1.854 m)  Wt 395 lb (179.171 kg)  BMI 52.13 kg/m2       Assessment & Plan:  1. Acute gout of left knee, unspecified cause Increased allopurinol to $RemoveBefore'300mg'[id number]$  daily Low purine diet encouraged - allopurinol (ZYLOPRIM) 300 MG tablet; Take 1 tablet (300 mg total) by mouth daily.  Dispense: 30 tablet; Refill: 6 - Arthritis Panel  2. Sleep apnea Needs CPAP mask and filter- will order  3. Essential hypertension Do not add slat to diet Added hctz to lisinopril today - lisinopril-hydrochlorothiazide (ZESTORETIC) 20-12.5 MG per tablet; Take 1 tablet by mouth daily.  Dispense: 90 tablet; Refill: 3 - CMP14+EGFR - NMR, lipoprofile  4. Severe obesity (BMI >= 40) Discussed diet and exercise for person with BMI >25 Will recheck weight in 3-6 months     Labs pending Health maintenance reviewed Diet and exercise encouraged Continue all meds Follow up  In 3 month   [place], FNP

## 2015-02-23 LAB — CMP14+EGFR
ALT: 43 IU/L (ref 0–44)
AST: 29 IU/L (ref 0–40)
Albumin/Globulin Ratio: 1.6 (ref 1.1–2.5)
Albumin: 4.4 g/dL (ref 3.5–5.5)
Alkaline Phosphatase: 80 IU/L (ref 39–117)
BUN/Creatinine Ratio: 13 (ref 8–19)
BUN: 10 mg/dL (ref 6–20)
Bilirubin Total: 0.5 mg/dL (ref 0.0–1.2)
CO2: 24 mmol/L (ref 18–29)
Calcium: 10 mg/dL (ref 8.7–10.2)
Chloride: 100 mmol/L (ref 97–108)
Creatinine, Ser: 0.8 mg/dL (ref 0.76–1.27)
GFR calc Af Amer: 134 mL/min/{1.73_m2} (ref >59)
GFR calc non Af Amer: 116 mL/min/{1.73_m2} (ref >59)
Globulin, Total: 2.8 g/dL (ref 1.5–4.5)
Glucose: 113 mg/dL — ABNORMAL HIGH (ref 65–99)
Potassium: 4.7 mmol/L (ref 3.5–5.2)
Sodium: 138 mmol/L (ref 134–144)
Total Protein: 7.2 g/dL (ref 6.0–8.5)

## 2015-02-23 LAB — NMR, LIPOPROFILE
Cholesterol: 283 mg/dL — ABNORMAL HIGH (ref 100–199)
HDL Cholesterol by NMR: 30 mg/dL — ABNORMAL LOW (ref >39)
HDL Particle Number: 28.1 umol/L — ABNORMAL LOW (ref >=30.5)
LDL Particle Number: 1378 nmol/L — ABNORMAL HIGH (ref <1000)
LDL Size: 19.8 nm (ref >20.5)
LP-IR Score: 90 — ABNORMAL HIGH (ref <=45)
Small LDL Particle Number: 696 nmol/L — ABNORMAL HIGH (ref <=527)
Triglycerides by NMR: 570 mg/dL (ref 0–149)

## 2015-02-23 LAB — ARTHRITIS PANEL
Basophils Absolute: 0 10*3/uL (ref 0.0–0.2)
Basos: 0 %
Eos: 2 %
Eosinophils Absolute: 0.1 10*3/uL (ref 0.0–0.4)
HCT: 44.1 % (ref 37.5–51.0)
Hemoglobin: 14.9 g/dL (ref 12.6–17.7)
Immature Grans (Abs): 0 10*3/uL (ref 0.0–0.1)
Immature Granulocytes: 0 %
Lymphocytes Absolute: 1.5 10*3/uL (ref 0.7–3.1)
Lymphs: 29 %
MCH: 30.5 pg (ref 26.6–33.0)
MCHC: 33.8 g/dL (ref 31.5–35.7)
MCV: 90 fL (ref 79–97)
Monocytes Absolute: 0.4 10*3/uL (ref 0.1–0.9)
Monocytes: 8 %
Neutrophils Absolute: 3.1 10*3/uL (ref 1.4–7.0)
Neutrophils Relative %: 61 %
Platelets: 301 10*3/uL (ref 150–379)
RBC: 4.89 x10E6/uL (ref 4.14–5.80)
RDW: 14.1 % (ref 12.3–15.4)
Rhuematoid fact SerPl-aCnc: 10.9 IU/mL (ref 0.0–13.9)
Sed Rate: 4 mm/hr (ref 0–15)
Uric Acid: 9.4 mg/dL — ABNORMAL HIGH (ref 3.7–8.6)
WBC: 5.1 10*3/uL (ref 3.4–10.8)

## 2015-02-25 ENCOUNTER — Telehealth: Payer: Self-pay | Admitting: Nurse Practitioner

## 2015-02-25 NOTE — Telephone Encounter (Signed)
Patient aware of results.

## 2015-02-28 ENCOUNTER — Other Ambulatory Visit: Payer: Self-pay | Admitting: Nurse Practitioner

## 2015-02-28 DIAGNOSIS — G4730 Sleep apnea, unspecified: Secondary | ICD-10-CM

## 2015-06-19 ENCOUNTER — Encounter: Payer: Self-pay | Admitting: Family Medicine

## 2015-06-19 ENCOUNTER — Ambulatory Visit (INDEPENDENT_AMBULATORY_CARE_PROVIDER_SITE_OTHER): Payer: Medicaid Other | Admitting: Family Medicine

## 2015-06-19 ENCOUNTER — Ambulatory Visit (INDEPENDENT_AMBULATORY_CARE_PROVIDER_SITE_OTHER): Payer: Medicaid Other

## 2015-06-19 VITALS — BP 135/90 | HR 116 | Temp 97.8°F | Ht 73.0 in | Wt 394.0 lb

## 2015-06-19 DIAGNOSIS — M79672 Pain in left foot: Secondary | ICD-10-CM

## 2015-06-19 DIAGNOSIS — M10072 Idiopathic gout, left ankle and foot: Secondary | ICD-10-CM

## 2015-06-19 MED ORDER — LISINOPRIL 20 MG PO TABS: 20.0000 mg | ORAL_TABLET | Freq: Every day | ORAL | Status: DC

## 2015-06-19 MED ORDER — TRAMADOL HCL 50 MG PO TABS: 50.0000 mg | ORAL_TABLET | Freq: Every evening | ORAL | Status: DC | PRN

## 2015-06-19 NOTE — Patient Instructions (Signed)
He can take no more than 3 colchicine daily of .6 mg-- he should take 2 initially and 1 one hour later  He can continue to take the Voltaren twice daily after breakfast and supper  He should elevate his foot and use ice off and on  He should reduce the lisinopril 20/12.5 to plain lisinopril 20 mg 1 daily  we should recheck him in one week

## 2015-06-19 NOTE — Progress Notes (Signed)
Subjective:    Patient ID: Kevin Pugh, male    DOB: [birth year], 34 y.o.   MRN: [id number]  HPI Pt here today for possible gout flare. He is complaining of left foot pain. He is currently taking allopurinol 300 mg and has colchicine .6 mg at home which he takes daily.  He is also taking Voltaren 75 twice daily. He cannot take prednisone. He is allergic to prednisone and has swelling and edema with this.      Patient Active Problem List   Diagnosis Date Noted  . Severe obesity (BMI >= 40) 02/22/2015  . Essential hypertension 02/22/2015  . Sleep apnea 02/22/2015  . Gout 01/01/2014   Outpatient Encounter Prescriptions as of 06/19/2015  Medication Sig  . albuterol (PROVENTIL HFA;VENTOLIN HFA) 108 (90 BASE) MCG/ACT inhaler Inhale 2 puffs into the lungs every 6 (six) hours as needed. For shortness of breath   . allopurinol (ZYLOPRIM) 300 MG tablet Take 1 tablet (300 mg total) by mouth daily.  . cetirizine (ZYRTEC) 10 MG tablet Take 10 mg by mouth at bedtime.   . cholecalciferol (VITAMIN D) 1000 UNITS tablet Take 1,000 Units by mouth daily.    . colchicine 0.6 MG tablet Take 1 tablet (0.6 mg total) by mouth daily.  . diclofenac (VOLTAREN) 75 MG EC tablet Take 1 tablet (75 mg total) by mouth 2 (two) times daily. FOR LOWER BACK PAIN  . fish oil-omega-3 fatty acids 1000 MG capsule Take 1 g by mouth daily.    Marland Kitchen lisinopril-hydrochlorothiazide (ZESTORETIC) 20-12.5 MG per tablet Take 1 tablet by mouth daily.   No facility-administered encounter medications on file as of 06/19/2015.     Review of Systems  Constitutional: Negative.   HENT: Negative.   Eyes: Negative.   Respiratory: Negative.   Cardiovascular: Negative.   Gastrointestinal: Negative.   Endocrine: Negative.   Genitourinary: Negative.   Musculoskeletal: Positive for arthralgias (left foot pain).  Skin: Negative.   Allergic/Immunologic: Negative.   Neurological: Negative.   Hematological: Negative.     Psychiatric/Behavioral: Negative.        Objective:   Physical Exam  Constitutional: He is oriented to person, place, and time. He appears well-developed and well-nourished. He appears distressed.  HENT:  Head: Normocephalic.  Musculoskeletal: He exhibits edema and tenderness.  Left lateral foot  Neurological: He is alert and oriented to person, place, and time.  Skin: Skin is warm and dry. No rash noted. No erythema. No pallor.  Psychiatric: He has a normal mood and affect. His behavior is normal. Judgment and thought content normal.  Nursing note and vitals reviewed.  BP 135/90 mmHg  Pulse 116  Temp(Src) 97.8 F (36.6 C) (Oral)  Ht $R'6\' 1"'fJ$  (1.854 m)  Wt 394 lb (178.717 kg)  BMI 51.99 kg/m2  [facility] reading (PRIMARY) by  DrMoore-   no active disease                                    Assessment & Plan:  1. Left foot pain -Elevate foot and continue to take colchicine as directed along with diclofenac -Also if the diclofenac is irritating the stomach he will take Zantac 150 twice daily before breakfast and supper - BMP8+EGFR - Uric acid - CBC with Differential/Platelet  2. Acute idiopathic gout of left foot -We will reduce the lisinopril HCT to lisinopril by itself 20 mg -Colchicine no more than 3 daily -Take  diclofenac as directed  Meds ordered this encounter  Medications  . lisinopril (PRINIVIL,ZESTRIL) 20 MG tablet    Sig: Take 1 tablet (20 mg total) by mouth daily.    Dispense:  30 tablet    Refill:  1   Patient Instructions   He can take no more than 3 colchicine daily of .6 mg-- he should take 2 initially and 1 one hour later  He can continue to take the Voltaren twice daily after breakfast and supper  He should elevate his foot and use ice off and on  He should reduce the lisinopril 20/12.5 to plain lisinopril 20 mg 1 daily  we should recheck him in one week   Arrie Senate MD

## 2015-06-20 ENCOUNTER — Telehealth: Payer: Self-pay | Admitting: *Deleted

## 2015-06-20 LAB — BMP8+EGFR
BUN/Creatinine Ratio: 13 (ref 8–19)
BUN: 13 mg/dL (ref 6–20)
CO2: 25 mmol/L (ref 18–29)
Calcium: 9.4 mg/dL (ref 8.7–10.2)
Chloride: 97 mmol/L (ref 97–108)
Creatinine, Ser: 1 mg/dL (ref 0.76–1.27)
GFR calc Af Amer: 112 mL/min/{1.73_m2} (ref >59)
GFR calc non Af Amer: 97 mL/min/{1.73_m2} (ref >59)
Glucose: 109 mg/dL — ABNORMAL HIGH (ref 65–99)
Potassium: 4.4 mmol/L (ref 3.5–5.2)
Sodium: 138 mmol/L (ref 134–144)

## 2015-06-20 LAB — CBC WITH DIFFERENTIAL/PLATELET
Basophils Absolute: 0.1 10*3/uL (ref 0.0–0.2)
Basos: 1 %
EOS (ABSOLUTE): 0.1 10*3/uL (ref 0.0–0.4)
Eos: 1 %
Hematocrit: 42.1 % (ref 37.5–51.0)
Hemoglobin: 14.2 g/dL (ref 12.6–17.7)
Immature Grans (Abs): 0 10*3/uL (ref 0.0–0.1)
Immature Granulocytes: 0 %
Lymphocytes Absolute: 2.3 10*3/uL (ref 0.7–3.1)
Lymphs: 23 %
MCH: 30.7 pg (ref 26.6–33.0)
MCHC: 33.7 g/dL (ref 31.5–35.7)
MCV: 91 fL (ref 79–97)
Monocytes Absolute: 0.6 10*3/uL (ref 0.1–0.9)
Monocytes: 6 %
Neutrophils Absolute: 7.1 10*3/uL — ABNORMAL HIGH (ref 1.4–7.0)
Neutrophils: 69 %
Platelets: 304 10*3/uL (ref 150–379)
RBC: 4.62 x10E6/uL (ref 4.14–5.80)
RDW: 14.3 % (ref 12.3–15.4)
WBC: 10.3 10*3/uL (ref 3.4–10.8)

## 2015-06-20 LAB — URIC ACID: Uric Acid: 8.1 mg/dL (ref 3.7–8.6)

## 2015-06-20 NOTE — Telephone Encounter (Signed)
-----   Message from Ernestina Penna, MD sent at 06/20/2015  8:27 AM EDT ----- The blood sugar was slightly elevated at 109. The creatinine, the most important kidney function test is within normal limits. The electrolytes including potassium are good. The uric acid value is 8.1. The target for people to have gout is less than 6. Your number is elevated but less so than 3 months ago when it was 9.4.----- please take the colchicine as directed yesterday 2 pills stat followed by 1 pill, 1 hour later-----you can do this daily until the inflammation resolves. Also continue to take the diclofenac as directed and protect her stomach with the Zantac for meals with the diclofenac after meals The CBC has a normal white blood cell count. The hemoglobin is good at 14.2. The platelet count is adequate.

## 2015-06-25 ENCOUNTER — Ambulatory Visit: Payer: Medicaid Other | Admitting: Family Medicine

## 2015-08-20 ENCOUNTER — Ambulatory Visit (INDEPENDENT_AMBULATORY_CARE_PROVIDER_SITE_OTHER): Payer: Medicaid Other

## 2015-08-20 DIAGNOSIS — Z23 Encounter for immunization: Secondary | ICD-10-CM

## 2015-10-30 ENCOUNTER — Other Ambulatory Visit: Payer: Self-pay

## 2015-10-30 DIAGNOSIS — M1000 Idiopathic gout, unspecified site: Secondary | ICD-10-CM

## 2015-10-30 MED ORDER — COLCHICINE 0.6 MG PO TABS: 0.6000 mg | ORAL_TABLET | Freq: Every day | ORAL | Status: DC

## 2015-11-06 ENCOUNTER — Ambulatory Visit (INDEPENDENT_AMBULATORY_CARE_PROVIDER_SITE_OTHER): Payer: Medicaid Other | Admitting: Family Medicine

## 2015-11-06 ENCOUNTER — Encounter: Payer: Self-pay | Admitting: Family Medicine

## 2015-11-06 VITALS — BP 141/94 | HR 94 | Temp 98.1°F | Ht 73.0 in | Wt >= 6400 oz

## 2015-11-06 DIAGNOSIS — J209 Acute bronchitis, unspecified: Secondary | ICD-10-CM

## 2015-11-06 MED ORDER — HYDROCODONE-HOMATROPINE 5-1.5 MG/5ML PO SYRP: 5.0000 mL | ORAL_SOLUTION | Freq: Three times a day (TID) | ORAL | Status: DC | PRN

## 2015-11-06 MED ORDER — DOXYCYCLINE HYCLATE 100 MG PO TABS: 100.0000 mg | ORAL_TABLET | Freq: Two times a day (BID) | ORAL | Status: DC

## 2015-11-06 NOTE — Progress Notes (Signed)
   Subjective:    Patient ID: Kevin Pugh, male    DOB: Oct 04, 1981, 34 y.o.   MRN: [id number]  HPI 34 year old gentleman with a one-week history of cough. The his sputum has changed from white to a darker green. He denies head congestion. He does not smoke. He does tell me he gets bronchitis yearly when the weather gets cooler. Of note [facility] that was discovered within the last few months. She has had a nephrectomy and now undergoing chemotherapy and he is concerned about germs around her.  Patient Active Problem List   Diagnosis Date Noted  . Severe obesity (BMI >= 40) (HCC) 02/22/2015  . Essential hypertension 02/22/2015  . Sleep apnea 02/22/2015  . Gout 01/01/2014   Outpatient Encounter Prescriptions as of 11/06/2015  Medication Sig  . albuterol (PROVENTIL HFA;VENTOLIN HFA) 108 (90 BASE) MCG/ACT inhaler Inhale 2 puffs into the lungs every 6 (six) hours as needed. For shortness of breath   . cetirizine (ZYRTEC) 10 MG tablet Take 10 mg by mouth at bedtime.   . cholecalciferol (VITAMIN D) 1000 UNITS tablet Take 1,000 Units by mouth daily.    . colchicine 0.6 MG tablet Take 1 tablet (0.6 mg total) by mouth daily.  . diclofenac (VOLTAREN) 75 MG EC tablet Take 1 tablet (75 mg total) by mouth 2 (two) times daily. FOR LOWER BACK PAIN  . fish oil-omega-3 fatty acids 1000 MG capsule Take 1 g by mouth daily.    Marland Kitchen. lisinopril (PRINIVIL,ZESTRIL) 20 MG tablet Take 1 tablet (20 mg total) by mouth daily.  . traMADol (ULTRAM) 50 MG tablet Take 1 tablet (50 mg total) by mouth at bedtime as needed.  . [DISCONTINUED] lisinopril-hydrochlorothiazide (ZESTORETIC) 20-12.5 MG per tablet Take 1 tablet by mouth daily.  Marland Kitchen. allopurinol (ZYLOPRIM) 300 MG tablet Take 1 tablet (300 mg total) by mouth daily. (Patient not taking: Reported on 11/06/2015)   No facility-administered encounter medications on file as of 11/06/2015.      Review of  Systems  Constitutional: Negative.   HENT: Positive for congestion.   Respiratory: Positive for cough.   Cardiovascular: Negative.   Neurological: Negative.   Psychiatric/Behavioral: Negative.        Objective:   Physical Exam  Constitutional: He is oriented to person, place, and time. He appears well-developed and well-nourished.  HENT:  Head: Normocephalic.  Cardiovascular: Normal rate and regular rhythm.   Pulmonary/Chest: Effort normal and breath sounds normal.  Neurological: He is alert and oriented to person, place, and time.          Assessment & Plan:  1. Acute bronchitis, unspecified organism Treat with doxycycline. Continue Mucinex. Rx for Hycodan to take at bedtime for cough suppression and to help him rest.  Frederica KusterStephen M Clelia Trabucco MD

## 2015-12-01 ENCOUNTER — Other Ambulatory Visit: Payer: Self-pay | Admitting: Family Medicine

## 2016-03-02 ENCOUNTER — Encounter: Payer: Self-pay | Admitting: Family Medicine

## 2016-03-02 ENCOUNTER — Ambulatory Visit (INDEPENDENT_AMBULATORY_CARE_PROVIDER_SITE_OTHER): Payer: Medicaid Other | Admitting: Family Medicine

## 2016-03-02 VITALS — BP 132/90 | HR 101 | Temp 97.9°F | Ht 73.0 in | Wt >= 6400 oz

## 2016-03-02 DIAGNOSIS — J209 Acute bronchitis, unspecified: Secondary | ICD-10-CM

## 2016-03-02 MED ORDER — DOXYCYCLINE HYCLATE 100 MG PO TABS: 100.0000 mg | ORAL_TABLET | Freq: Two times a day (BID) | ORAL | Status: DC

## 2016-03-02 MED ORDER — HYDROCODONE-HOMATROPINE 5-1.5 MG/5ML PO SYRP: 5.0000 mL | ORAL_SOLUTION | Freq: Three times a day (TID) | ORAL | Status: DC | PRN

## 2016-03-02 NOTE — Progress Notes (Signed)
BP 132/90 mmHg  Pulse 101  Temp(Src) 97.9 F (36.6 C) (Oral)  Ht  (1.854 m)  Wt 413 lb (187.336 kg)  BMI 54.50 kg/m2   Subjective:    Patient ID: Kevin Pugh, male    DOB: [birth year], 35 y.o.   MRN: [id number]  HPI: Kevin Pugh is a 35 y.o. male presenting on 03/02/2016 for Cough and Chest congestion   HPI Cough and chest congestion Patient is coming in today with one-week history of cough and chest congestion and postnasal drainage. He denies any fevers or chills or shortness of breath or wheezing. His cough has become more productive over the past few days and it has been worse at night. He has been using Zyrtec and his albuterol inhaler. He is just concerned because he is going to take his daughter in for another round of chemotherapy and he doesn't want to have an illness that may get her severely ill.  Relevant past medical, surgical, family and social history reviewed and updated as indicated. Interim medical history since our last visit reviewed. Allergies and medications reviewed and updated.  Review of Systems  Constitutional: Negative for fever and chills.  HENT: Positive for congestion, postnasal drip, rhinorrhea, sinus pressure, sneezing and sore throat. Negative for ear discharge, ear pain and voice change.   Eyes: Negative for pain, discharge, redness and visual disturbance.  Respiratory: Positive for cough. Negative for chest tightness, shortness of breath and wheezing.   Cardiovascular: Negative for chest pain and leg swelling.  Gastrointestinal: Negative for abdominal pain, diarrhea and constipation.  Genitourinary: Negative for difficulty urinating.  Musculoskeletal: Negative for back pain and gait problem.  Skin: Negative for rash.  Neurological: Negative for syncope, light-headedness and headaches.  All other systems reviewed and are negative.   Per HPI unless specifically indicated above     Medication List       This list is accurate as of:  03/02/16 12:49 PM.  Always use your most recent med list.               albuterol 108 (90 Base) MCG/ACT inhaler  Commonly known as:  PROVENTIL HFA;VENTOLIN HFA  Inhale 2 puffs into the lungs every 6 (six) hours as needed. For shortness of breath     cetirizine 10 MG tablet  Commonly known as:  ZYRTEC  Take 10 mg by mouth at bedtime.     cholecalciferol 1000 units tablet  Commonly known as:  VITAMIN D  Take 1,000 Units by mouth daily.     colchicine 0.6 MG tablet  TAKE ONE TABLET BY MOUTH ONE TIME DAILY     diclofenac 75 MG EC tablet  Commonly known as:  VOLTAREN  Take 1 tablet (75 mg total) by mouth 2 (two) times daily. FOR LOWER BACK PAIN     doxycycline 100 MG tablet  Commonly known as:  VIBRA-TABS  Take 1 tablet (100 mg total) by mouth 2 (two) times daily.     fish oil-omega-3 fatty acids 1000 MG capsule  Take 1 g by mouth daily.     HYDROcodone-homatropine 5-1.5 MG/5ML syrup  Commonly known as:  HYCODAN  Take 5 mLs by mouth every 8 (eight) hours as needed for cough.     lisinopril 20 MG tablet  Commonly known as:  PRINIVIL,ZESTRIL  Take 1 tablet (20 mg total) by mouth daily.           Objective:    BP 132/90 mmHg  Pulse 101  Temp(Src) 97.9 F (36.6 C) (Oral)  Ht 6\' 1"  (1.854 m)  Wt 413 lb (187.336 kg)  BMI 54.50 kg/m2  Wt Readings from Last 3 Encounters:  03/02/16 413 lb (187.336 kg)  11/06/15 400 lb 6.4 oz (181.62 kg)  06/19/15 394 lb (178.717 kg)    Physical Exam  Constitutional: He is oriented to person, place, and time. He appears well-developed and well-nourished. No distress.  HENT:  Right Ear: Tympanic membrane, external ear and ear canal normal.  Left Ear: Tympanic membrane, external ear and ear canal normal.  Nose: Mucosal edema and rhinorrhea present. No sinus tenderness. No epistaxis. Right sinus exhibits maxillary sinus tenderness. Right sinus exhibits no frontal sinus tenderness. Left sinus exhibits maxillary sinus tenderness. Left sinus  exhibits no frontal sinus tenderness.  Mouth/Throat: Uvula is midline and mucous membranes are normal. Posterior oropharyngeal edema and posterior oropharyngeal erythema present. No oropharyngeal exudate or tonsillar abscesses.  Eyes: Conjunctivae and EOM are normal. Pupils are equal, round, and reactive to light. Right eye exhibits no discharge. No scleral icterus.  Neck: Neck supple. No thyromegaly present.  Cardiovascular: Normal rate, regular rhythm, normal heart sounds and intact distal pulses.   No murmur heard. Pulmonary/Chest: Effort normal and breath sounds normal. No respiratory distress. He has no wheezes. He has no rales.  Musculoskeletal: Normal range of motion. He exhibits no edema.  Lymphadenopathy:    He has no cervical adenopathy.  Neurological: He is alert and oriented to person, place, and time. Coordination normal.  Skin: Skin is warm and dry. No rash noted. He is not diaphoretic.  Psychiatric: He has a normal mood and affect. His behavior is normal.  Nursing note and vitals reviewed.     Assessment & Plan:   Problem List Items Addressed This Visit    None    Visit Diagnoses    Acute bronchitis, unspecified organism    -  Primary    Relevant Medications    HYDROcodone-homatropine (HYCODAN) 5-1.5 MG/5ML syrup    doxycycline (VIBRA-TABS) 100 MG tablet       Follow up plan: Return if symptoms worsen or fail to improve.  Counseling provided for all of the vaccine components No orders of the defined types were placed in this encounter.    Arville CareJoshua Dettinger, MD [facility] 03/02/2016, 12:49 PM

## 2016-03-04 ENCOUNTER — Other Ambulatory Visit: Payer: Self-pay | Admitting: Nurse Practitioner

## 2016-03-14 ENCOUNTER — Other Ambulatory Visit: Payer: Self-pay | Admitting: Family Medicine

## 2016-06-14 ENCOUNTER — Other Ambulatory Visit: Payer: Self-pay | Admitting: Family Medicine

## 2016-07-27 ENCOUNTER — Other Ambulatory Visit: Payer: Self-pay

## 2016-07-27 DIAGNOSIS — J209 Acute bronchitis, unspecified: Secondary | ICD-10-CM

## 2016-07-27 MED ORDER — COLCHICINE 0.6 MG PO TABS: 0.6000 mg | ORAL_TABLET | Freq: Every day | ORAL | 0 refills | Status: DC

## 2016-07-27 MED ORDER — HYDROCODONE-HOMATROPINE 5-1.5 MG/5ML PO SYRP: 5.0000 mL | ORAL_SOLUTION | Freq: Three times a day (TID) | ORAL | 0 refills | Status: DC | PRN

## 2016-07-27 MED ORDER — LISINOPRIL 20 MG PO TABS: 20.0000 mg | ORAL_TABLET | Freq: Every day | ORAL | 0 refills | Status: DC

## 2016-08-24 ENCOUNTER — Other Ambulatory Visit: Payer: Self-pay | Admitting: Family Medicine

## 2016-09-09 ENCOUNTER — Other Ambulatory Visit: Payer: Self-pay | Admitting: Family Medicine

## 2016-09-16 ENCOUNTER — Other Ambulatory Visit: Payer: Self-pay | Admitting: *Deleted

## 2016-09-17 ENCOUNTER — Telehealth: Payer: Self-pay

## 2016-09-17 MED ORDER — COLCHICINE 0.6 MG PO CAPS: 0.6000 mg | ORAL_CAPSULE | Freq: Every day | ORAL | 1 refills | Status: DC

## 2016-09-17 NOTE — Telephone Encounter (Signed)
Colchicine changed to capsule per insurance

## 2016-09-20 ENCOUNTER — Other Ambulatory Visit: Payer: Self-pay | Admitting: Family Medicine

## 2016-10-17 ENCOUNTER — Other Ambulatory Visit: Payer: Self-pay | Admitting: Family Medicine

## 2016-11-10 ENCOUNTER — Encounter: Payer: Self-pay | Admitting: Family Medicine

## 2016-11-10 ENCOUNTER — Ambulatory Visit (INDEPENDENT_AMBULATORY_CARE_PROVIDER_SITE_OTHER): Payer: Medicaid Other | Admitting: Family Medicine

## 2016-11-10 DIAGNOSIS — J209 Acute bronchitis, unspecified: Secondary | ICD-10-CM

## 2016-11-10 MED ORDER — DOXYCYCLINE HYCLATE 100 MG PO TABS: 100.0000 mg | ORAL_TABLET | Freq: Two times a day (BID) | ORAL | 0 refills | Status: DC

## 2016-11-10 MED ORDER — HYDROCODONE-HOMATROPINE 5-1.5 MG/5ML PO SYRP: 5.0000 mL | ORAL_SOLUTION | Freq: Three times a day (TID) | ORAL | 0 refills | Status: DC | PRN

## 2016-11-10 NOTE — Patient Instructions (Signed)
Great to see you!   Acute Bronchitis, Adult Acute bronchitis is when air tubes (bronchi) in the lungs suddenly get swollen. The condition can make it hard to breathe. It can also cause these symptoms:  A cough.  Coughing up clear, yellow, or green mucus.  Wheezing.  Chest congestion.  Shortness of breath.  A fever.  Body aches.  Chills.  A sore throat.  Follow these instructions at home: Medicines  Take over-the-counter and prescription medicines only as told by your doctor.  If you were prescribed an antibiotic medicine, take it as told by your doctor. Do not stop taking the antibiotic even if you start to feel better. General instructions  Rest.  Drink enough fluids to keep your pee (urine) clear or pale yellow.  Avoid smoking and secondhand smoke. If you smoke and you need help quitting, ask your doctor. Quitting will help your lungs heal faster.  Use an inhaler, cool mist vaporizer, or humidifier as told by your doctor.  Keep all follow-up visits as told by your doctor. This is important. How is this prevented? To lower your risk of getting this condition again:  Wash your hands often with soap and water. If you cannot use soap and water, use hand sanitizer.  Avoid contact with people who have cold symptoms.  Try not to touch your hands to your mouth, nose, or eyes.  Make sure to get the flu shot every year.  Contact a doctor if:  Your symptoms do not get better in 2 weeks. Get help right away if:  You cough up blood.  You have chest pain.  You have very bad shortness of breath.  You become dehydrated.  You faint (pass out) or keep feeling like you are going to pass out.  You keep throwing up (vomiting).  You have a very bad headache.  Your fever or chills gets worse. This information is not intended to replace advice given to you by your health care provider. Make sure you discuss any questions you have with your health care  provider. Document Released: 04/13/2008 Document Revised: 06/03/2016 Document Reviewed: 04/15/2016 Elsevier Interactive Patient Education  2017 [employer]  

## 2016-11-10 NOTE — Progress Notes (Signed)
   HPI  Patient presents today here with cough and congestion.  Patient explains that over the last 1 week he has had severe cough. He states that a few days ago he began improving and then steadily worsening in. He is having some mild shortness of breath. He has persistent cough productive of green thick sputum. He has chest pain with cough, sneezing.  His daughter has cancer and he has been in and out of the hospital with her getting treatments.  He had a similar illness about 7 months ago and was treated with doxycycline and Hycodan and had very rapid results. He would like to get better quickly so that he can go back to the hospital with his daughter.  Tolerating food and fluids normally.   PMH: Smoking status noted ROS: Per HPI  Objective: BP (!) 153/87   Pulse (!) 105   Temp 97.8 F (36.6 C) (Oral)   Ht 6\' 1"  (1.854 m)   Wt (!) 418 lb (189.6 kg)   BMI 55.15 kg/m  Gen: NAD, alert, cooperative with exam HEENT: NCAT, oropharynx clear and moist with enlarged tonsils CV: RRR, good S1/S2, no murmur Resp: CTABL, no wheezes, non-labored Ext: No edema, warm Neuro: Alert and oriented, No gross deficits  Assessment and plan:  # Acute bronchitis Possibly viral, however given that he had transient improvement and worsening on concern for "second sickness" bacterial infection Treatment doxycycline, Hycodan to take for nighttime cough, discussed not to drive with it. Return to clinic with any concerns or worsening symptoms     Meds ordered this encounter  Medications  . doxycycline (VIBRA-TABS) 100 MG tablet    Sig: Take 1 tablet (100 mg total) by mouth 2 (two) times daily.    Dispense:  20 tablet    Refill:  0  . HYDROcodone-homatropine (HYCODAN) 5-1.5 MG/5ML syrup    Sig: Take 5 mLs by mouth every 8 (eight) hours as needed for cough.    Dispense:  120 mL    Refill:  0    Murtis SinkSam Bradshaw, MD [facility] 11/10/2016, 4:31 PM

## 2016-11-29 ENCOUNTER — Other Ambulatory Visit: Payer: Self-pay | Admitting: Family

## 2016-12-11 ENCOUNTER — Other Ambulatory Visit: Payer: Self-pay | Admitting: Family Medicine

## 2017-03-14 ENCOUNTER — Other Ambulatory Visit: Payer: Self-pay | Admitting: Family Medicine

## 2017-04-14 ENCOUNTER — Other Ambulatory Visit: Payer: Self-pay | Admitting: Family Medicine

## 2017-05-16 ENCOUNTER — Other Ambulatory Visit: Payer: Self-pay | Admitting: Family Medicine

## 2017-06-02 ENCOUNTER — Other Ambulatory Visit: Payer: Self-pay | Admitting: Family Medicine

## 2017-06-03 NOTE — Telephone Encounter (Signed)
Last seen 12/07/16  Dr Dettinger   

## 2017-06-14 ENCOUNTER — Other Ambulatory Visit: Payer: Self-pay | Admitting: Family Medicine

## 2017-06-14 NOTE — Telephone Encounter (Signed)
Last seen 11/10/16  Dr Ermalinda MemosBradshaw

## 2017-06-14 NOTE — Telephone Encounter (Signed)
Defer to pcp  Kevin SinkSam Bradshaw, MD [facility] 06/14/2017, 1:01 PM

## 2017-06-14 NOTE — Telephone Encounter (Signed)
Go ahead and refill for 1 month, patient needs to come in for blood pressure recheck and fasting labs for the next time he needs a refill.

## 2017-06-30 ENCOUNTER — Other Ambulatory Visit: Payer: Self-pay | Admitting: Family Medicine

## 2017-07-13 ENCOUNTER — Other Ambulatory Visit: Payer: Self-pay | Admitting: Family Medicine

## 2017-08-11 ENCOUNTER — Other Ambulatory Visit: Payer: Self-pay | Admitting: Family Medicine

## 2017-08-12 NOTE — Telephone Encounter (Signed)
Last seen 11/18/16  Dr Ermalinda Memos  Dr Dettinger PCP

## 2017-08-15 ENCOUNTER — Other Ambulatory Visit: Payer: Self-pay | Admitting: Family Medicine

## 2017-08-19 ENCOUNTER — Other Ambulatory Visit: Payer: Self-pay | Admitting: Family Medicine

## 2017-08-19 MED ORDER — LISINOPRIL 20 MG PO TABS: ORAL_TABLET | ORAL | 0 refills | Status: DC

## 2017-08-19 NOTE — Telephone Encounter (Signed)
If approved please sign order

## 2017-08-26 ENCOUNTER — Ambulatory Visit (INDEPENDENT_AMBULATORY_CARE_PROVIDER_SITE_OTHER): Payer: Medicaid Other | Admitting: Family Medicine

## 2017-08-26 ENCOUNTER — Encounter: Payer: Self-pay | Admitting: Family Medicine

## 2017-08-26 VITALS — BP 169/101 | HR 103 | Temp 98.0°F | Ht 73.0 in | Wt >= 6400 oz

## 2017-08-26 DIAGNOSIS — Z23 Encounter for immunization: Secondary | ICD-10-CM | POA: Diagnosis not present

## 2017-08-26 DIAGNOSIS — I10 Essential (primary) hypertension: Secondary | ICD-10-CM

## 2017-08-26 DIAGNOSIS — R739 Hyperglycemia, unspecified: Secondary | ICD-10-CM | POA: Diagnosis not present

## 2017-08-26 MED ORDER — LISINOPRIL 20 MG PO TABS: ORAL_TABLET | ORAL | 1 refills | Status: DC

## 2017-08-26 MED ORDER — AMLODIPINE BESYLATE 5 MG PO TABS: 5.0000 mg | ORAL_TABLET | Freq: Every day | ORAL | 1 refills | Status: DC

## 2017-08-26 NOTE — Progress Notes (Signed)
BP (!) 176/108   Pulse (!) 103   Temp 98 F (36.7 C) (Oral)   Ht '6\' 1"'$  (1.854 m)   Wt (!) 429 lb (194.6 kg)   BMI 56.60 kg/m    Subjective:    Patient ID: Kevin Pugh, male    DOB: [birth year], 36 y.o.   MRN: [id number]  HPI: Kevin Pugh is a 36 y.o. male presenting on 08/26/2017 for Hypertension (follow up)   HPI Hypertension Patient is currently on lisinopril, and their blood pressure today is 176/108, he says it is running very similar at home. Patient denies any lightheadedness or dizziness. Patient denies headaches, blurred vision, chest pains, shortness of breath, or weakness. Denies any side effects from medication and is content with current medication.  Patient is obese and has been gaining weight recently because of stress and dealing with his daughter going through chemotherapy and cancer and spending a lot of time at the hospital.  Relevant past medical, surgical, family and social history reviewed and updated as indicated. Interim medical history since our last visit reviewed. Allergies and medications reviewed and updated.  Review of Systems  Constitutional: Negative for chills and fever.  Respiratory: Negative for shortness of breath and wheezing.   Cardiovascular: Negative for chest pain and leg swelling.  Musculoskeletal: Negative for back pain and gait problem.  Skin: Negative for rash.  Neurological: Negative for dizziness, weakness, light-headedness, numbness and headaches.  All other systems reviewed and are negative.   Per HPI unless specifically indicated above        Objective:    BP (!) 176/108   Pulse (!) 103   Temp 98 F (36.7 C) (Oral)   Ht '6\' 1"'$  (1.854 m)   Wt (!) 429 lb (194.6 kg)   BMI 56.60 kg/m   Wt Readings from Last 3 Encounters:  08/26/17 (!) 429 lb (194.6 kg)  11/10/16 (!) 418 lb (189.6 kg)  03/02/16 (!) 413 lb (187.3 kg)    Physical Exam  Constitutional: He is oriented to person, place, and time. He appears  well-developed and well-nourished. No distress.  Eyes: Conjunctivae are normal. No scleral icterus.  Neck: Neck supple. No thyromegaly present.  Cardiovascular: Normal rate, regular rhythm, normal heart sounds and intact distal pulses.   No murmur heard. Pulmonary/Chest: Effort normal and breath sounds normal. No respiratory distress. He has no wheezes. He has no rales.  Musculoskeletal: Normal range of motion. He exhibits no edema.  Lymphadenopathy:    He has no cervical adenopathy.  Neurological: He is alert and oriented to person, place, and time. Coordination normal.  Skin: Skin is warm and dry. No rash noted. He is not diaphoretic.  Psychiatric: He has a normal mood and affect. His behavior is normal.  Nursing note and vitals reviewed.       Assessment & Plan:   Problem List Items Addressed This Visit      Cardiovascular and Mediastinum   Essential hypertension - Primary   Relevant Medications   lisinopril (PRINIVIL,ZESTRIL) 20 MG tablet   amLODipine (NORVASC) 5 MG tablet   Other Relevant Orders   CMP14+EGFR   Lipid panel   TSH     Other   Severe obesity (BMI >= 40) (HCC)   Relevant Orders   Lipid panel   TSH       Follow up plan: Return in about 4 weeks (around 09/23/2017), or if symptoms worsen or fail to improve, for Hypertension recheck.  Counseling provided for all  of the vaccine components No orders of the defined types were placed in this encounter.   Caryl Pina, MD [facility] 08/26/2017, 3:59 PM

## 2017-08-27 LAB — CMP14+EGFR
ALT: 34 IU/L (ref 0–44)
AST: 30 IU/L (ref 0–40)
Albumin/Globulin Ratio: 1.7 (ref 1.2–2.2)
Albumin: 4.2 g/dL (ref 3.5–5.5)
Alkaline Phosphatase: 83 IU/L (ref 39–117)
BUN/Creatinine Ratio: 15 (ref 9–20)
BUN: 11 mg/dL (ref 6–20)
Bilirubin Total: 0.2 mg/dL (ref 0.0–1.2)
CO2: 20 mmol/L (ref 20–29)
Calcium: 9.2 mg/dL (ref 8.7–10.2)
Chloride: 101 mmol/L (ref 96–106)
Creatinine, Ser: 0.72 mg/dL — ABNORMAL LOW (ref 0.76–1.27)
GFR calc Af Amer: 138 mL/min/{1.73_m2} (ref >59)
GFR calc non Af Amer: 119 mL/min/{1.73_m2} (ref >59)
Globulin, Total: 2.5 g/dL (ref 1.5–4.5)
Glucose: 163 mg/dL — ABNORMAL HIGH (ref 65–99)
Potassium: 4.2 mmol/L (ref 3.5–5.2)
Sodium: 137 mmol/L (ref 134–144)
Total Protein: 6.7 g/dL (ref 6.0–8.5)

## 2017-08-27 LAB — LIPID PANEL
Chol/HDL Ratio: 11.2 ratio — ABNORMAL HIGH (ref 0.0–5.0)
Cholesterol, Total: 292 mg/dL — ABNORMAL HIGH (ref 100–199)
HDL: 26 mg/dL — ABNORMAL LOW (ref >39)
Triglycerides: 922 mg/dL (ref 0–149)

## 2017-08-27 LAB — TSH: TSH: 0.752 u[IU]/mL (ref 0.450–4.500)

## 2017-08-30 ENCOUNTER — Other Ambulatory Visit: Payer: Self-pay

## 2017-08-30 MED ORDER — ROSUVASTATIN CALCIUM 20 MG PO TABS: 20.0000 mg | ORAL_TABLET | Freq: Every day | ORAL | 1 refills | Status: DC

## 2017-08-30 NOTE — Telephone Encounter (Signed)
Patient aware of results.

## 2017-09-01 LAB — BAYER DCA HB A1C WAIVED: HB A1C (BAYER DCA - WAIVED): 6.2 % (ref <7.0)

## 2017-09-01 NOTE — Addendum Note (Signed)
Addended by: Margorie JohnJOHNSON, Ivonne [place] M on: 09/01/2017 02:43 PM   Modules accepted: Orders

## 2017-09-01 NOTE — Addendum Note (Signed)
Addended by: Quay BurowPOTTER, Jules Baty K on: 09/01/2017 01:46 PM   Modules accepted: Orders

## 2017-09-02 ENCOUNTER — Encounter: Payer: Self-pay | Admitting: *Deleted

## 2017-09-02 DIAGNOSIS — R7303 Prediabetes: Secondary | ICD-10-CM

## 2017-09-08 ENCOUNTER — Other Ambulatory Visit: Payer: Self-pay | Admitting: Family Medicine

## 2017-09-27 ENCOUNTER — Ambulatory Visit: Payer: Medicaid Other | Admitting: Family Medicine

## 2017-09-27 ENCOUNTER — Encounter: Payer: Self-pay | Admitting: Family Medicine

## 2017-09-27 VITALS — BP 151/101 | HR 103 | Temp 97.6°F | Ht 73.0 in | Wt >= 6400 oz

## 2017-09-27 DIAGNOSIS — J209 Acute bronchitis, unspecified: Secondary | ICD-10-CM

## 2017-09-27 DIAGNOSIS — I10 Essential (primary) hypertension: Secondary | ICD-10-CM | POA: Diagnosis not present

## 2017-09-27 MED ORDER — HYDROCODONE-HOMATROPINE 5-1.5 MG/5ML PO SYRP: 5.0000 mL | ORAL_SOLUTION | Freq: Three times a day (TID) | ORAL | 0 refills | Status: DC | PRN

## 2017-09-27 NOTE — Progress Notes (Signed)
BP (!) 151/101   Pulse (!) 103   Temp 97.6 F (36.4 C) (Oral)   Ht 6\' 1"  (1.854 m)   Wt (!) 430 lb (195 kg)   BMI 56.73 kg/m    Subjective:    Patient ID: Kevin PertJason L Doble, male    DOB: 26-Aug-1981, 36 y.o.   MRN: [id number]  HPI: Kevin Pugh is a 36 y.o. male presenting on 09/27/2017 for Hypertension (4 week follow up) and Cough   HPI Hypertension Patient is currently on amlodipine and lisinopril, and their blood pressure today is 151/101, patient has been having a cough and has been taking medicines with dextromethorphan which may be why it has been more elevated because he says at home it was running better initially once he started the amlodipine.  He says he has been feeling a lot better since starting it. Patient denies any lightheadedness or dizziness. Patient denies headaches, blurred vision, chest pains, shortness of breath, or weakness. Denies any side effects from medication and is content with current medication.   Cough and congestion Patient has been having cough and congestion this been going on for the past week.  He said whenever he had lasting for cough syrup worked well for him and that was affordable.  He denies any shortness of breath or wheezing or fevers or chills but just wants to get a hold of it before it gets bad like he had previously.  Relevant past medical, surgical, family and social history reviewed and updated as indicated. Interim medical history since our last visit reviewed. Allergies and medications reviewed and updated.  Review of Systems  Constitutional: Negative for chills and fever.  HENT: Positive for congestion, sinus pressure and sore throat. Negative for ear discharge, ear pain, postnasal drip, rhinorrhea, sneezing and voice change.   Eyes: Negative for pain, discharge, redness and visual disturbance.  Respiratory: Positive for cough. Negative for shortness of breath and wheezing.   Cardiovascular: Negative for chest pain and leg swelling.    Musculoskeletal: Negative for back pain and gait problem.  Skin: Negative for rash.  Neurological: Negative for dizziness, weakness, light-headedness and headaches.  All other systems reviewed and are negative.   Per HPI unless specifically indicated above        Objective:    BP (!) 163/104   Pulse (!) 103   Temp 97.6 F (36.4 C) (Oral)   Ht 6\' 1"  (1.854 m)   Wt (!) 430 lb (195 kg)   BMI 56.73 kg/m   Wt Readings from Last 3 Encounters:  09/27/17 (!) 430 lb (195 kg)  08/26/17 (!) 429 lb (194.6 kg)  11/10/16 (!) 418 lb (189.6 kg)    Physical Exam  Constitutional: He is oriented to person, place, and time. He appears well-developed and well-nourished. No distress.  HENT:  Right Ear: Tympanic membrane, external ear and ear canal normal.  Left Ear: Tympanic membrane, external ear and ear canal normal.  Nose: Mucosal edema and rhinorrhea present. No sinus tenderness. No epistaxis. Right sinus exhibits no maxillary sinus tenderness and no frontal sinus tenderness. Left sinus exhibits no maxillary sinus tenderness and no frontal sinus tenderness.  Mouth/Throat: Uvula is midline and mucous membranes are normal. Posterior oropharyngeal edema present. No oropharyngeal exudate, posterior oropharyngeal erythema or tonsillar abscesses.  Eyes: Conjunctivae and EOM are normal. Pupils are equal, round, and reactive to light. No scleral icterus.  Neck: Neck supple. No thyromegaly present.  Cardiovascular: Normal rate, regular rhythm, normal heart sounds and  intact distal pulses.  No murmur heard. Pulmonary/Chest: Effort normal and breath sounds normal. No respiratory distress. He has no wheezes. He has no rales.  Musculoskeletal: Normal range of motion. He exhibits no edema.  Lymphadenopathy:    He has no cervical adenopathy.  Neurological: He is alert and oriented to person, place, and time. Coordination normal.  Skin: Skin is warm and dry. No rash noted. He is not diaphoretic.   Psychiatric: He has a normal mood and affect. His behavior is normal.  Nursing note and vitals reviewed.       Assessment & Plan:   Problem List Items Addressed This Visit      Cardiovascular and Mediastinum   Essential hypertension - Primary     Other   Severe obesity (BMI >= 40) (HCC)    Other Visit Diagnoses    Acute bronchitis, unspecified organism       Relevant Medications   HYDROcodone-homatropine (HYCODAN) 5-1.5 MG/5ML syrup       Follow up plan: Return in about 3 months (around 12/28/2017), or if symptoms worsen or fail to improve, for 1 week for blood pressure recheck with nurse.  Counseling provided for all of the vaccine components No orders of the defined types were placed in this encounter.   Arville CareJoshua Eilyn Polack, MD [facility] 09/27/2017, 4:24 PM

## 2017-10-06 ENCOUNTER — Ambulatory Visit (INDEPENDENT_AMBULATORY_CARE_PROVIDER_SITE_OTHER): Payer: Medicaid Other | Admitting: *Deleted

## 2017-10-06 ENCOUNTER — Ambulatory Visit: Payer: Self-pay | Admitting: Registered"

## 2017-10-06 VITALS — BP 143/91 | HR 112

## 2017-10-06 DIAGNOSIS — Z0130 Encounter for examination of blood pressure without abnormal findings: Secondary | ICD-10-CM

## 2017-10-06 NOTE — Progress Notes (Signed)
Pt here for BP check BP 143 91     P 112 rck   BP 143 95    P 109

## 2017-11-04 ENCOUNTER — Other Ambulatory Visit: Payer: Self-pay | Admitting: Family Medicine

## 2017-12-02 ENCOUNTER — Other Ambulatory Visit: Payer: Self-pay | Admitting: Family

## 2018-01-05 ENCOUNTER — Other Ambulatory Visit: Payer: Self-pay | Admitting: Family Medicine

## 2018-01-06 ENCOUNTER — Telehealth: Payer: Self-pay

## 2018-01-06 NOTE — Telephone Encounter (Signed)
mitigare is the same thing is colchicine just a different brand, go ahead and call the pharmacy and just tell him to change it to that one instead since colchicine was not covered Arville CareJoshua Claudie Rathbone, MD [facility] 01/06/2018, 12:55 PM

## 2018-01-06 NOTE — Telephone Encounter (Signed)
Medicaid non preferred Colchiine  Preferred are allopurinol tab., Mitigare cap., probenecid tab., probenecid colchicine tab

## 2018-02-13 ENCOUNTER — Other Ambulatory Visit: Payer: Self-pay | Admitting: Family Medicine

## 2018-02-22 ENCOUNTER — Other Ambulatory Visit: Payer: Self-pay | Admitting: Family Medicine

## 2018-04-16 ENCOUNTER — Other Ambulatory Visit: Payer: Self-pay | Admitting: Family Medicine

## 2018-04-21 ENCOUNTER — Telehealth: Payer: Self-pay | Admitting: Family Medicine

## 2018-04-21 ENCOUNTER — Encounter: Payer: Self-pay | Admitting: Family Medicine

## 2018-04-21 ENCOUNTER — Ambulatory Visit: Payer: Medicaid Other | Admitting: Family Medicine

## 2018-04-21 VITALS — BP 148/87 | HR 119 | Temp 98.2°F | Ht 73.0 in | Wt >= 6400 oz

## 2018-04-21 DIAGNOSIS — M109 Gout, unspecified: Secondary | ICD-10-CM

## 2018-04-21 DIAGNOSIS — I10 Essential (primary) hypertension: Secondary | ICD-10-CM | POA: Diagnosis not present

## 2018-04-21 DIAGNOSIS — R739 Hyperglycemia, unspecified: Secondary | ICD-10-CM | POA: Insufficient documentation

## 2018-04-21 LAB — CBC WITH DIFFERENTIAL/PLATELET
Basophils Absolute: 0 10*3/uL (ref 0.0–0.2)
Basos: 0 %
EOS (ABSOLUTE): 0.1 10*3/uL (ref 0.0–0.4)
Eos: 1 %
Hematocrit: 47.4 % (ref 37.5–51.0)
Hemoglobin: 16.1 g/dL (ref 13.0–17.7)
Immature Grans (Abs): 0 10*3/uL (ref 0.0–0.1)
Immature Granulocytes: 0 %
Lymphocytes Absolute: 2.1 10*3/uL (ref 0.7–3.1)
Lymphs: 19 %
MCH: 31.2 pg (ref 26.6–33.0)
MCHC: 34 g/dL (ref 31.5–35.7)
MCV: 92 fL (ref 79–97)
Monocytes Absolute: 0.6 10*3/uL (ref 0.1–0.9)
Monocytes: 5 %
Neutrophils Absolute: 8.1 10*3/uL — ABNORMAL HIGH (ref 1.4–7.0)
Neutrophils: 75 %
Platelets: 269 10*3/uL (ref 150–450)
RBC: 5.16 x10E6/uL (ref 4.14–5.80)
RDW: 13.5 % (ref 12.3–15.4)
WBC: 11 10*3/uL — ABNORMAL HIGH (ref 3.4–10.8)

## 2018-04-21 LAB — CMP14+EGFR
ALT: 45 IU/L — ABNORMAL HIGH (ref 0–44)
AST: 44 IU/L — ABNORMAL HIGH (ref 0–40)
Albumin/Globulin Ratio: 1.9 (ref 1.2–2.2)
Albumin: 4.7 g/dL (ref 3.5–5.5)
Alkaline Phosphatase: 119 IU/L — ABNORMAL HIGH (ref 39–117)
BUN/Creatinine Ratio: 12 (ref 9–20)
BUN: 13 mg/dL (ref 6–20)
Bilirubin Total: 0.8 mg/dL (ref 0.0–1.2)
CO2: 21 mmol/L (ref 20–29)
Calcium: 9.9 mg/dL (ref 8.7–10.2)
Chloride: 94 mmol/L — ABNORMAL LOW (ref 96–106)
Creatinine, Ser: 1.1 mg/dL (ref 0.76–1.27)
GFR calc Af Amer: 98 mL/min/{1.73_m2} (ref >59)
GFR calc non Af Amer: 85 mL/min/{1.73_m2} (ref >59)
Globulin, Total: 2.5 g/dL (ref 1.5–4.5)
Glucose: 348 mg/dL — ABNORMAL HIGH (ref 65–99)
Potassium: 4.7 mmol/L (ref 3.5–5.2)
Sodium: 136 mmol/L (ref 134–144)
Total Protein: 7.2 g/dL (ref 6.0–8.5)

## 2018-04-21 LAB — LIPID PANEL
Chol/HDL Ratio: 6.3 ratio — ABNORMAL HIGH (ref 0.0–5.0)
Cholesterol, Total: 225 mg/dL — ABNORMAL HIGH (ref 100–199)
HDL: 36 mg/dL — ABNORMAL LOW (ref >39)
Triglycerides: 587 mg/dL (ref 0–149)

## 2018-04-21 LAB — BAYER DCA HB A1C WAIVED: HB A1C (BAYER DCA - WAIVED): 9.8 % — ABNORMAL HIGH (ref <7.0)

## 2018-04-21 MED ORDER — SITAGLIPTIN PHOS-METFORMIN HCL 50-1000 MG PO TABS: 1.0000 | ORAL_TABLET | Freq: Two times a day (BID) | ORAL | 3 refills | Status: DC

## 2018-04-21 MED ORDER — KETOROLAC TROMETHAMINE 60 MG/2ML IM SOLN
60.0000 mg | Freq: Once | INTRAMUSCULAR | Status: AC
  Administered 2018-04-21: 60 mg via INTRAMUSCULAR

## 2018-04-21 NOTE — Telephone Encounter (Signed)
Please let patient know that I sent Janumet for him, he needs to take this twice a day and then see him back in 3 months.  Keep an eye on his sugars and if they are still running high get a call Arville CareJoshua Odis Turck, MD [facility] 04/21/2018, 2:29 PM

## 2018-04-21 NOTE — Telephone Encounter (Signed)
Pt aware.

## 2018-04-21 NOTE — Progress Notes (Signed)
BP (!) 148/87   Pulse (!) 119   Temp 98.2 F (36.8 C) (Oral)   Ht '6\' 1"'$  (1.854 m)   Wt (!) 405 lb (183.7 kg)   BMI 53.43 kg/m    Subjective:    Patient ID: Kevin Pugh, male    DOB: 15-Jan-1981, 37 y.o.   MRN: [id number]  HPI: Kevin Pugh is a 37 y.o. male presenting on 04/21/2018 for Gout (left foot, waiting on prior authorization for colchicine) and Hyperglycemia (checked BS on dad's meter recently, 300 fasting, 400 + not fasting)   HPI Hypertension Patient is currently on amlodipine and lisinopril, and their blood pressure today is 148/87. Patient denies any lightheadedness or dizziness. Patient denies headaches, blurred vision, chest pains, shortness of breath, or weakness. Denies any side effects from medication and is content with current medication.   Elevated blood sugars Patient has elevated blood sugars at home, he is checked it over the weekend and had as high as 300 or 400 on his father's meter and has been having urinary frequency over the weekend but he says that is resolved over the past couple days but he has not checked his blood sugars over the past couple days.  He was previously diagnosed with prediabetes and had an A1c of 6.1 but we will recheck it again.  Left ankle gout Patient has been having left ankle pain that has been bothering him for the past few days.  He does frequently get gout in that ankle and foot but this time is more of the ankle.  He has had some swelling a little bit of redness but more has significant pain.  He describes the pain is severe and he is coming in today because he has not been able to get his colchicine because of some insurance coverage problems.  We have tried to send it again because they wanted a different brand name than the brand that was initially sent in and hopefully he should be able to get it today.  Relevant past medical, surgical, family and social history reviewed and updated as indicated. Interim medical history since  our last visit reviewed. Allergies and medications reviewed and updated.  Review of Systems  Constitutional: Negative for chills and fever.  Eyes: Negative for discharge.  Respiratory: Negative for shortness of breath and wheezing.   Cardiovascular: Negative for chest pain and leg swelling.  Endocrine: Negative for polydipsia.  Genitourinary: Positive for frequency.  Musculoskeletal: Positive for arthralgias and joint swelling. Negative for back pain and gait problem.  Skin: Positive for color change. Negative for rash.  All other systems reviewed and are negative.   Per HPI unless specifically indicated above   Allergies as of 04/21/2018      Reactions   Prednisone Anaphylaxis, Hives, Swelling   Keflex [cephalexin Monohydrate]    Like thrush on the outside of mouth      Medication List        Accurate as of 04/21/18  8:57 AM. Always use your most recent med list.          albuterol 108 (90 Base) MCG/ACT inhaler Commonly known as:  PROVENTIL HFA;VENTOLIN HFA Inhale 2 puffs into the lungs every 6 (six) hours as needed. For shortness of breath   amLODipine 5 MG tablet Commonly known as:  NORVASC TAKE 1 TABLET BY MOUTH EVERY DAY   cetirizine 10 MG tablet Commonly known as:  ZYRTEC Take 10 mg by mouth at bedtime.   cholecalciferol 1000  units tablet Commonly known as:  VITAMIN D Take 1,000 Units by mouth daily.   colchicine 0.6 MG tablet TAKE 1 TABLET (0.6 MG TOTAL) BY MOUTH DAILY.   MITIGARE 0.6 MG Caps Generic drug:  Colchicine TAKE 1 CAPSULE BY MOUTH EVERY DAY   diclofenac 75 MG EC tablet Commonly known as:  VOLTAREN Take 1 tablet (75 mg total) by mouth 2 (two) times daily. FOR LOWER BACK PAIN   fish oil-omega-3 fatty acids 1000 MG capsule Take 1 g by mouth daily.   lisinopril 20 MG tablet Commonly known as:  PRINIVIL,ZESTRIL TAKE 1 TABLET BY MOUTH EVERY DAY   rosuvastatin 20 MG tablet Commonly known as:  CRESTOR TAKE 1 TABLET BY MOUTH EVERY DAY            Objective:    BP (!) 148/87   Pulse (!) 119   Temp 98.2 F (36.8 C) (Oral)   Ht '6\' 1"'$  (1.854 m)   Wt (!) 405 lb (183.7 kg)   BMI 53.43 kg/m   Wt Readings from Last 3 Encounters:  04/21/18 (!) 405 lb (183.7 kg)  09/27/17 (!) 430 lb (195 kg)  08/26/17 (!) 429 lb (194.6 kg)    Physical Exam  Constitutional: He is oriented to person, place, and time. He appears well-developed and well-nourished. No distress.  Eyes: Pupils are equal, round, and reactive to light. Conjunctivae and EOM are normal. No scleral icterus.  Neck: Neck supple. No thyromegaly present.  Cardiovascular: Normal rate, regular rhythm, normal heart sounds and intact distal pulses.  No murmur heard. Pulmonary/Chest: Effort normal and breath sounds normal. No respiratory distress. He has no wheezes.  Musculoskeletal: Normal range of motion. He exhibits tenderness (Pain and swelling and slight warmth in ankle of left foot, pain with all range of motion, no numbness or weakness noted, capillary refill intact). He exhibits no edema.  Lymphadenopathy:    He has no cervical adenopathy.  Neurological: He is alert and oriented to person, place, and time. Coordination normal.  Skin: Skin is warm and dry. No rash noted. He is not diaphoretic.  Psychiatric: He has a normal mood and affect. His behavior is normal.  Nursing note and vitals reviewed.       Assessment & Plan:   Problem List Items Addressed This Visit      Cardiovascular and Mediastinum   Essential hypertension - Primary   Relevant Orders   CMP14+EGFR     Other   Gout   Relevant Medications   ketorolac (TORADOL) injection 60 mg (Completed)   Other Relevant Orders   CBC with Differential/Platelet   Uric acid   Severe obesity (BMI >= 40) (HCC)   Relevant Orders   Lipid panel   Elevated blood sugar   Relevant Orders   Bayer DCA Hb A1c Waived       Follow up plan: Return in about 1 month (around 05/19/2018), or if symptoms worsen or fail to  improve, for Hypertension and blood sugar recheck.  Counseling provided for all of the vaccine components Orders Placed This Encounter  Procedures  . Bayer DCA Hb A1c Waived  . CBC with Differential/Platelet  . CMP14+EGFR  . Lipid panel    Caryl Pina, MD [facility] 04/21/2018, 8:57 AM

## 2018-04-23 LAB — URIC ACID: Uric Acid: 7.7 mg/dL (ref 3.7–8.6)

## 2018-04-23 LAB — SPECIMEN STATUS REPORT

## 2018-04-26 ENCOUNTER — Other Ambulatory Visit: Payer: Self-pay | Admitting: Family Medicine

## 2018-04-26 ENCOUNTER — Other Ambulatory Visit: Payer: Self-pay | Admitting: *Deleted

## 2018-04-26 ENCOUNTER — Telehealth: Payer: Self-pay

## 2018-04-26 DIAGNOSIS — E119 Type 2 diabetes mellitus without complications: Secondary | ICD-10-CM

## 2018-04-26 MED ORDER — METFORMIN HCL 1000 MG PO TABS: 1000.0000 mg | ORAL_TABLET | Freq: Two times a day (BID) | ORAL | 3 refills | Status: DC

## 2018-04-26 NOTE — Telephone Encounter (Signed)
Yes, stop the metformin and start the Janument. This  Has Januvia and metformin together in one pill. Strict low carb diet and exercise.

## 2018-04-26 NOTE — Addendum Note (Signed)
Addended by: Quay BurowPOTTER, Drevin Ortner K on: 04/26/2018 03:35 PM   Modules accepted: Orders

## 2018-04-26 NOTE — Telephone Encounter (Signed)
Medicaid denied Janumet because patient has not tried anything else

## 2018-04-26 NOTE — Telephone Encounter (Signed)
Patient aware to discontinue Metformin and start Janumet.

## 2018-04-26 NOTE — Telephone Encounter (Signed)
Metformin sent to pharmacy

## 2018-04-27 NOTE — Telephone Encounter (Signed)
Okay I sent metformin for the patient already

## 2018-05-10 DIAGNOSIS — I10 Essential (primary) hypertension: Secondary | ICD-10-CM | POA: Diagnosis not present

## 2018-05-10 DIAGNOSIS — M5416 Radiculopathy, lumbar region: Secondary | ICD-10-CM | POA: Diagnosis not present

## 2018-05-10 DIAGNOSIS — Z6841 Body Mass Index (BMI) 40.0 and over, adult: Secondary | ICD-10-CM | POA: Diagnosis not present

## 2018-05-10 DIAGNOSIS — M545 Low back pain: Secondary | ICD-10-CM | POA: Diagnosis not present

## 2018-05-16 ENCOUNTER — Other Ambulatory Visit: Payer: Self-pay | Admitting: Family Medicine

## 2018-05-24 ENCOUNTER — Other Ambulatory Visit: Payer: Self-pay | Admitting: Family Medicine

## 2018-05-25 ENCOUNTER — Encounter: Payer: Medicaid Other | Attending: Family Medicine | Admitting: Skilled Nursing Facility1

## 2018-05-25 ENCOUNTER — Encounter: Payer: Self-pay | Admitting: Skilled Nursing Facility1

## 2018-05-25 DIAGNOSIS — E119 Type 2 diabetes mellitus without complications: Secondary | ICD-10-CM | POA: Insufficient documentation

## 2018-05-25 DIAGNOSIS — Z713 Dietary counseling and surveillance: Secondary | ICD-10-CM | POA: Diagnosis not present

## 2018-05-25 DIAGNOSIS — Z6841 Body Mass Index (BMI) 40.0 and over, adult: Secondary | ICD-10-CM | POA: Diagnosis not present

## 2018-05-25 NOTE — Progress Notes (Signed)
Diabetes Self-Management Education  Visit Type: First/Initial  05/25/2018  Mr. Kevin Pugh, identified by name and date of birth, is a 37 y.o. male with a diagnosis of Diabetes: Type 2.   ASSESSMENT  Height 6\' 1"  (1.854 m), weight (!) 403 lb (182.8 kg). Body mass index is 53.17 kg/m. Pts A1C 9.8 Pt states his daughter is being treated for cancer currently.  Pt states he has been making changes with his sugary beverages.  Pt states he checks his blood sugar 1 time a  Day and also when he is feeling off: fasting 140 and sometimes at night 2 hours after dinner 200. Pt states he is taking the janumet.  Pt states he emotionally eats as a reaction to his daughters cancer treatments.   Diabetes Self-Management Education - 05/25/18 1016      Visit Information   Visit Type  First/Initial      Initial Visit   Diabetes Type  Type 2    Are you currently following a meal plan?  No    Are you taking your medications as prescribed?  Yes      Health Coping   How would you rate your overall health?  Good      Psychosocial Assessment   Patient Belief/Attitude about Diabetes  Motivated to manage diabetes    Self-management support  Family      Pre-Education Assessment   Patient understands the diabetes disease and treatment process.  Needs Instruction    Patient understands incorporating nutritional management into lifestyle.  Needs Instruction    Patient undertands incorporating physical activity into lifestyle.  Needs Instruction    Patient understands using medications safely.  Needs Instruction    Patient understands monitoring blood glucose, interpreting and using results  Needs Instruction    Patient understands prevention, detection, and treatment of acute complications.  Needs Instruction    Patient understands prevention, detection, and treatment of chronic complications.  Needs Instruction    Patient understands how to develop strategies to address psychosocial issues.  Needs  Instruction    Patient understands how to develop strategies to promote health/change behavior.  Needs Instruction      Complications   Last HgB A1C per patient/outside source  9.8 %    How often do you check your blood sugar?  1-2 times/day    Fasting Blood glucose range (mg/dL)  161-096    Postprandial Blood glucose range (mg/dL)  >045    Number of hypoglycemic episodes per month  0    Number of hyperglycemic episodes per week  2    Can you tell when your blood sugar is high?  Yes    What do you do if your blood sugar is high?  drinking water    Have you had a dilated eye exam in the past 12 months?  No    Have you had a dental exam in the past 12 months?  No    Are you checking your feet?  No      Dietary Intake   Breakfast  tomato sandwich or frozen sausage biscuit or eggs and sasuage     Lunch  skipped or sandwich or frozen meal    Dinner  rice or potatoes with chicken r pork or hamburger with bread and greens or peas or broccoli    Snack (evening)  cheezits and chips or fruit     Beverage(s)  soda, water, cool aid, sweet tea, V8 juice or tomato juice  Exercise   Exercise Type  ADL's    How many days per week to you exercise?  0    How many minutes per day do you exercise?  0    Total minutes per week of exercise  0      Patient Education   Disease state   Definition of diabetes, type 1 and 2, and the diagnosis of diabetes;Factors that contribute to the development of diabetes    Nutrition management   Food label reading, portion sizes and measuring food.;Role of diet in the treatment of diabetes and the relationship between the three main macronutrients and blood glucose level;Meal options for control of blood glucose level and chronic complications.    Physical activity and exercise   Role of exercise on diabetes management, blood pressure control and cardiac health.;Identified with patient nutritional and/or medication changes necessary with exercise.    Monitoring   Taught/evaluated SMBG meter.;Purpose and frequency of SMBG.;Yearly dilated eye exam;Daily foot exams    Acute complications  Taught treatment of hypoglycemia - the 15 rule.    Chronic complications  Assessed and discussed foot care and prevention of foot problems;Lipid levels, blood glucose control and heart disease;Nephropathy, what it is, prevention of, the use of ACE, ARB's and early detection of through urine microalbumia.;Retinopathy and reason for yearly dilated eye exams    Psychosocial adjustment  Role of stress on diabetes      Individualized Goals (developed by patient)   Nutrition  Follow meal plan discussed;General guidelines for healthy choices and portions discussed    Physical Activity  30 minutes per day;Exercise 5-7 days per week    Monitoring   test blood glucose pre and post meals as discussed;test my blood glucose as discussed      Post-Education Assessment   Patient understands the diabetes disease and treatment process.  Demonstrates understanding / competency    Patient understands incorporating nutritional management into lifestyle.  Demonstrates understanding / competency    Patient undertands incorporating physical activity into lifestyle.  Demonstrates understanding / competency    Patient understands using medications safely.  Demonstrates understanding / competency    Patient understands monitoring blood glucose, interpreting and using results  Demonstrates understanding / competency    Patient understands prevention, detection, and treatment of acute complications.  Demonstrates understanding / competency    Patient understands prevention, detection, and treatment of chronic complications.  Demonstrates understanding / competency    Patient understands how to develop strategies to address psychosocial issues.  Demonstrates understanding / competency    Patient understands how to develop strategies to promote health/change behavior.  Demonstrates understanding /  competency      Outcomes   Expected Outcomes  Demonstrated interest in learning. Expect positive outcomes    Future DMSE  PRN    Program Status  Completed       Individualized Plan for Diabetes Self-Management Training:   Learning Objective:  Patient will have a greater understanding of diabetes self-management. Patient education plan is to attend individual and/or group sessions per assessed needs and concerns.   Plan:   There are no Patient Instructions on file for this visit.  Expected Outcomes:  Demonstrated interest in learning. Expect positive outcomes  Education material provided: ADA Diabetes: Your Take Control Guide, Food label handouts, Meal plan card, My Plate and Snack sheet  If problems or questions, patient to contact team via:  Phone  Future DSME appointment: PRN

## 2018-06-16 ENCOUNTER — Other Ambulatory Visit: Payer: Self-pay | Admitting: Family Medicine

## 2018-06-21 ENCOUNTER — Telehealth: Payer: Self-pay

## 2018-06-21 NOTE — Telephone Encounter (Signed)
Medicaid non preferred Colchicine  Preferred are Allopurinol tab., Mitigare cap., Probenecid tab., Probenecid-colchicine tab.

## 2018-06-21 NOTE — Telephone Encounter (Signed)
Please call pharmacy and just have them change it to mitigare which is the same thing his colchicine just a different brand

## 2018-06-22 ENCOUNTER — Telehealth: Payer: Self-pay

## 2018-06-22 MED ORDER — MITIGARE 0.6 MG PO CAPS: 1.0000 | ORAL_CAPSULE | Freq: Every day | ORAL | 2 refills | Status: DC

## 2018-06-22 NOTE — Telephone Encounter (Signed)
Printed out prescription for patient

## 2018-06-22 NOTE — Telephone Encounter (Signed)
For Medicaid to cover Mitigare needs to be a hand written RX with Brand Name Only signed by Dr Dettinger  Not faxed to done electronically

## 2018-06-22 NOTE — Addendum Note (Signed)
Addended by: Tamera PuntWRAY, Camry Theiss S on: 06/22/2018 11:21 AM   Modules accepted: Orders

## 2018-06-22 NOTE — Telephone Encounter (Signed)
Patient aware to pick up 

## 2018-06-22 NOTE — Telephone Encounter (Signed)
Pharmacy aware

## 2018-08-04 ENCOUNTER — Other Ambulatory Visit: Payer: Self-pay | Admitting: Family Medicine

## 2018-08-04 DIAGNOSIS — M545 Low back pain: Secondary | ICD-10-CM | POA: Diagnosis not present

## 2018-08-04 DIAGNOSIS — I10 Essential (primary) hypertension: Secondary | ICD-10-CM | POA: Diagnosis not present

## 2018-08-04 DIAGNOSIS — M5416 Radiculopathy, lumbar region: Secondary | ICD-10-CM | POA: Diagnosis not present

## 2018-08-04 DIAGNOSIS — Z6841 Body Mass Index (BMI) 40.0 and over, adult: Secondary | ICD-10-CM | POA: Diagnosis not present

## 2018-11-26 ENCOUNTER — Other Ambulatory Visit: Payer: Self-pay | Admitting: Family Medicine

## 2018-11-28 NOTE — Telephone Encounter (Signed)
Last seen 04/21/18

## 2018-12-22 DIAGNOSIS — Z6841 Body Mass Index (BMI) 40.0 and over, adult: Secondary | ICD-10-CM | POA: Diagnosis not present

## 2018-12-22 DIAGNOSIS — I10 Essential (primary) hypertension: Secondary | ICD-10-CM | POA: Diagnosis not present

## 2018-12-22 DIAGNOSIS — M5416 Radiculopathy, lumbar region: Secondary | ICD-10-CM | POA: Diagnosis not present

## 2018-12-22 DIAGNOSIS — M545 Low back pain: Secondary | ICD-10-CM | POA: Diagnosis not present

## 2018-12-23 DIAGNOSIS — G4733 Obstructive sleep apnea (adult) (pediatric): Secondary | ICD-10-CM | POA: Diagnosis not present

## 2019-01-08 ENCOUNTER — Other Ambulatory Visit: Payer: Self-pay | Admitting: Family Medicine

## 2019-02-01 ENCOUNTER — Other Ambulatory Visit: Payer: Self-pay | Admitting: Family Medicine

## 2019-02-01 NOTE — Telephone Encounter (Signed)
Last seen 04/21/18

## 2019-02-26 ENCOUNTER — Other Ambulatory Visit: Payer: Self-pay | Admitting: Family Medicine

## 2019-02-27 NOTE — Telephone Encounter (Signed)
Apt made

## 2019-02-27 NOTE — Telephone Encounter (Signed)
Dettinger. NTBS 30 days given 02/01/19. Last OV 04/2018

## 2019-03-03 ENCOUNTER — Encounter: Payer: Self-pay | Admitting: Family Medicine

## 2019-03-03 ENCOUNTER — Ambulatory Visit (INDEPENDENT_AMBULATORY_CARE_PROVIDER_SITE_OTHER): Payer: Medicaid Other | Admitting: Family Medicine

## 2019-03-03 ENCOUNTER — Other Ambulatory Visit: Payer: Self-pay

## 2019-03-03 DIAGNOSIS — I10 Essential (primary) hypertension: Secondary | ICD-10-CM

## 2019-03-03 DIAGNOSIS — E1169 Type 2 diabetes mellitus with other specified complication: Secondary | ICD-10-CM

## 2019-03-03 DIAGNOSIS — E785 Hyperlipidemia, unspecified: Secondary | ICD-10-CM | POA: Diagnosis not present

## 2019-03-03 MED ORDER — SITAGLIPTIN PHOS-METFORMIN HCL 50-1000 MG PO TABS: 1.0000 | ORAL_TABLET | Freq: Two times a day (BID) | ORAL | 3 refills | Status: DC

## 2019-03-03 MED ORDER — LISINOPRIL 20 MG PO TABS: 20.0000 mg | ORAL_TABLET | Freq: Every day | ORAL | 3 refills | Status: DC

## 2019-03-03 MED ORDER — ROSUVASTATIN CALCIUM 20 MG PO TABS: 20.0000 mg | ORAL_TABLET | Freq: Every day | ORAL | 3 refills | Status: DC

## 2019-03-03 MED ORDER — AMLODIPINE BESYLATE 10 MG PO TABS: 10.0000 mg | ORAL_TABLET | Freq: Every day | ORAL | 3 refills | Status: DC

## 2019-03-03 MED ORDER — MITIGARE 0.6 MG PO CAPS: 1.0000 | ORAL_CAPSULE | Freq: Every day | ORAL | 3 refills | Status: DC

## 2019-03-03 NOTE — Progress Notes (Signed)
Virtual Visit via telephone Note  I connected with Kevin Pugh on 03/03/19 at 1045 by telephone and verified that I am speaking with the correct person using two identifiers. Kevin PertJason L Barbier is currently located at home and no other people are currently with her during visit. The provider, Elige RadonJoshua A Dettinger, MD is located in their office at time of visit.  Call ended at 1101  I discussed the limitations, risks, security and privacy concerns of performing an evaluation and management service by telephone and the availability of in person appointments. I also discussed with the patient that there may be a patient responsible charge related to this service. The patient expressed understanding and agreed to proceed.   History and Present Illness: Type 2 diabetes mellitus Patient comes in today for recheck of his diabetes. Patient has been currently taking janumet. Patient is currently on an ACE inhibitor/ARB. Patient has not seen an ophthalmologist this year. Patient denies any issues with their feet. Patient saw a nutritionist and has made a lot of changes with diabetes and his blood sugars typically running between 101 140 most of the time, every now and then he checks after meal and it may be up to 200 but not typically over that.  Hypertension Patient is currently on amlodipine and lisinopril, and their blood pressure today is 170/100 at home. Patient denies any lightheadedness or dizziness. Patient denies headaches, blurred vision, chest pains, shortness of breath, or weakness. Denies any side effects from medication and is content with current medication.   Hyperlipidemia Patient is coming in for recheck of his hyperlipidemia. The patient is currently taking crestor. They deny any issues with myalgias or history of liver damage from it. They deny any focal numbness or weakness or chest pain.   No diagnosis found.  Outpatient Encounter Medications as of 03/03/2019  Medication Sig  .  albuterol (PROVENTIL HFA;VENTOLIN HFA) 108 (90 BASE) MCG/ACT inhaler Inhale 2 puffs into the lungs every 6 (six) hours as needed. For shortness of breath   . amLODipine (NORVASC) 5 MG tablet TAKE 1 TABLET BY MOUTH EVERY DAY  . cetirizine (ZYRTEC) 10 MG tablet Take 10 mg by mouth at bedtime.   . cholecalciferol (VITAMIN D) 1000 UNITS tablet Take 1,000 Units by mouth daily.    . diclofenac (VOLTAREN) 75 MG EC tablet Take 1 tablet (75 mg total) by mouth 2 (two) times daily. FOR LOWER BACK PAIN  . fish oil-omega-3 fatty acids 1000 MG capsule Take 1 g by mouth daily.    Marland Kitchen. JANUMET 50-1000 MG tablet TAKE 1 TABLET BY MOUTH 2 (TWO) TIMES DAILY WITH A MEAL. (NEEDS TO BE SEEN BEFORE NEXT REFILL)  . lisinopril (PRINIVIL,ZESTRIL) 20 MG tablet TAKE 1 TABLET (20 MG TOTAL) BY MOUTH DAILY. (NEEDS TO BE SEEN BEFORE NEXT REFILL)  . MITIGARE 0.6 MG CAPS Take 1 tablet by mouth daily.  . rosuvastatin (CRESTOR) 20 MG tablet TAKE 1 TABLET BY MOUTH EVERY DAY   No facility-administered encounter medications on file as of 03/03/2019.     Review of Systems  Constitutional: Negative for chills and fever.  Eyes: Negative for visual disturbance.  Respiratory: Negative for shortness of breath and wheezing.   Cardiovascular: Negative for chest pain and leg swelling.  Musculoskeletal: Negative for back pain and gait problem.  Skin: Negative for rash.  Neurological: Negative for dizziness, weakness, light-headedness and numbness.  All other systems reviewed and are negative.   Observations/Objective: Patient sounds comfortable and no in no acute  distress  Assessment and Plan: Problem List Items Addressed This Visit      Cardiovascular and Mediastinum   Essential hypertension   Relevant Medications   amLODipine (NORVASC) 10 MG tablet   lisinopril (ZESTRIL) 20 MG tablet   rosuvastatin (CRESTOR) 20 MG tablet     Endocrine   Type 2 diabetes mellitus with other specified complication (HCC) - Primary   Relevant  Medications   sitaGLIPtin-metformin (JANUMET) 50-1000 MG tablet   lisinopril (ZESTRIL) 20 MG tablet   rosuvastatin (CRESTOR) 20 MG tablet   Hyperlipidemia associated with type 2 diabetes mellitus (HCC)   Relevant Medications   sitaGLIPtin-metformin (JANUMET) 50-1000 MG tablet   lisinopril (ZESTRIL) 20 MG tablet   rosuvastatin (CRESTOR) 20 MG tablet       Follow Up Instructions: Follow up in 3 months in dm and htn    I discussed the assessment and treatment plan with the patient. The patient was provided an opportunity to ask questions and all were answered. The patient agreed with the plan and demonstrated an understanding of the instructions.   The patient was advised to call back or seek an in-person evaluation if the symptoms worsen or if the condition fails to improve as anticipated.  The above assessment and management plan was discussed with the patient. The patient verbalized understanding of and has agreed to the management plan. Patient is aware to call the clinic if symptoms persist or worsen. Patient is aware when to return to the clinic for a follow-up visit. Patient educated on when it is appropriate to go to the emergency department.    I provided 16 minutes of non-face-to-face time during this encounter.    Nils Pyle, MD

## 2019-03-07 DIAGNOSIS — M47819 Spondylosis without myelopathy or radiculopathy, site unspecified: Secondary | ICD-10-CM | POA: Diagnosis not present

## 2019-03-07 DIAGNOSIS — M4699 Unspecified inflammatory spondylopathy, multiple sites in spine: Secondary | ICD-10-CM | POA: Diagnosis not present

## 2019-04-06 ENCOUNTER — Other Ambulatory Visit: Payer: Self-pay | Admitting: Family Medicine

## 2019-04-19 ENCOUNTER — Other Ambulatory Visit: Payer: Self-pay | Admitting: Family Medicine

## 2019-04-26 ENCOUNTER — Other Ambulatory Visit: Payer: Self-pay | Admitting: *Deleted

## 2019-04-26 MED ORDER — AMLODIPINE BESYLATE 10 MG PO TABS: 10.0000 mg | ORAL_TABLET | Freq: Every day | ORAL | 3 refills | Status: DC

## 2019-05-11 ENCOUNTER — Telehealth: Payer: Self-pay | Admitting: Family Medicine

## 2019-05-11 NOTE — Telephone Encounter (Signed)
I don't have it and don't recall seeing it. 

## 2019-05-11 NOTE — Telephone Encounter (Signed)
Kevin Pugh will be back Monday to address- VM left for patient that she is out of the office and will return Monday. Please advise

## 2019-05-11 NOTE — Telephone Encounter (Signed)
Have you seen this? I know nothing about this.

## 2019-05-15 NOTE — Telephone Encounter (Signed)
Pt is needing OV to sign forms, appt sched, called [facility] for ppw to be refaxed

## 2019-05-19 ENCOUNTER — Ambulatory Visit (INDEPENDENT_AMBULATORY_CARE_PROVIDER_SITE_OTHER): Payer: Medicaid Other | Admitting: Family Medicine

## 2019-05-19 ENCOUNTER — Other Ambulatory Visit: Payer: Self-pay

## 2019-05-19 ENCOUNTER — Encounter: Payer: Self-pay | Admitting: Family Medicine

## 2019-05-19 DIAGNOSIS — G4733 Obstructive sleep apnea (adult) (pediatric): Secondary | ICD-10-CM | POA: Diagnosis not present

## 2019-05-19 NOTE — Progress Notes (Signed)
Virtual Visit via telephone Note  I connected with Kevin Pugh on 05/19/19 at 1438 by telephone and verified that I am speaking with the correct person using two identifiers. Kevin Pugh is currently located at home and no other people are currently with her during visit. The provider, Elige RadonJoshua A Kollin Udell, MD is located in their office at time of visit.  Call ended at 1448  I discussed the limitations, risks, security and privacy concerns of performing an evaluation and management service by telephone and the availability of in person appointments. I also discussed with the patient that there may be a patient responsible charge related to this service. The patient expressed understanding and agreed to proceed.   History and Present Illness: Sleep apnea Patient has sleep apnea.  He is using it everynight for about 6-8 hours.  He feels like he gets restful sleep and have energy through day.  He has nasal cpap mask and his setting is on 12 mmHg.  He has had it for 10 years and that was his last sleep study.   No diagnosis found.  Outpatient Encounter Medications as of 05/19/2019  Medication Sig  . albuterol (PROVENTIL HFA;VENTOLIN HFA) 108 (90 BASE) MCG/ACT inhaler Inhale 2 puffs into the lungs every 6 (six) hours as needed. For shortness of breath   . amLODipine (NORVASC) 10 MG tablet Take 1 tablet (10 mg total) by mouth daily.  . cetirizine (ZYRTEC) 10 MG tablet Take 10 mg by mouth at bedtime.   . cholecalciferol (VITAMIN D) 1000 UNITS tablet Take 1,000 Units by mouth daily.    . diclofenac (VOLTAREN) 75 MG EC tablet Take 1 tablet (75 mg total) by mouth 2 (two) times daily. FOR LOWER BACK PAIN  . fish oil-omega-3 fatty acids 1000 MG capsule Take 1 g by mouth daily.    Marland Kitchen. lisinopril (ZESTRIL) 20 MG tablet Take 1 tablet (20 mg total) by mouth daily. (Needs to be seen before next refill)  . MITIGARE 0.6 MG CAPS TAKE 1 CAPSULE BY MOUTH EVERY DAY  . rosuvastatin (CRESTOR) 20 MG tablet Take 1  tablet (20 mg total) by mouth daily.  . sitaGLIPtin-metformin (JANUMET) 50-1000 MG tablet Take 1 tablet by mouth 2 (two) times daily with a meal.   No facility-administered encounter medications on file as of 05/19/2019.     Review of Systems  Constitutional: Negative for chills and fever.  Respiratory: Negative for shortness of breath and wheezing.   Cardiovascular: Negative for chest pain and leg swelling.  Musculoskeletal: Negative for back pain and gait problem.  Skin: Negative for rash.  Neurological: Negative for dizziness, weakness and light-headedness.  Psychiatric/Behavioral: Positive for sleep disturbance.  All other systems reviewed and are negative.   Observations/Objective: Patient sounds comfortable and in no acute distress  Assessment and Plan: Problem List Items Addressed This Visit      Respiratory   Sleep apnea - Primary   Relevant Orders   For home use only DME continuous positive airway pressure (CPAP)   For home use only DME Other see comment      [facility] is who does it but it was bought out by new  Follow Up Instructions:  Follow up in 4 weeks for diabetes and htn   I discussed the assessment and treatment plan with the patient. The patient was provided an opportunity to ask questions and all were answered. The patient agreed with the plan and demonstrated an understanding of the instructions.   The patient  was advised to call back or seek an in-person evaluation if the symptoms worsen or if the condition fails to improve as anticipated.  The above assessment and management plan was discussed with the patient. The patient verbalized understanding of and has agreed to the management plan. Patient is aware to call the clinic if symptoms persist or worsen. Patient is aware when to return to the clinic for a follow-up visit. Patient educated on when it is appropriate to go to the emergency department.    I provided 10 minutes of non-face-to-face time during  this encounter.    Worthy Rancher, MD

## 2019-05-22 DIAGNOSIS — G4733 Obstructive sleep apnea (adult) (pediatric): Secondary | ICD-10-CM | POA: Diagnosis not present

## 2019-05-24 DIAGNOSIS — M4699 Unspecified inflammatory spondylopathy, multiple sites in spine: Secondary | ICD-10-CM | POA: Diagnosis not present

## 2019-05-24 DIAGNOSIS — I10 Essential (primary) hypertension: Secondary | ICD-10-CM | POA: Diagnosis not present

## 2019-05-24 DIAGNOSIS — Z6841 Body Mass Index (BMI) 40.0 and over, adult: Secondary | ICD-10-CM | POA: Diagnosis not present

## 2019-06-13 DIAGNOSIS — G4733 Obstructive sleep apnea (adult) (pediatric): Secondary | ICD-10-CM | POA: Diagnosis not present

## 2019-06-26 ENCOUNTER — Other Ambulatory Visit: Payer: Self-pay | Admitting: Family Medicine

## 2019-07-11 DIAGNOSIS — G4733 Obstructive sleep apnea (adult) (pediatric): Secondary | ICD-10-CM | POA: Diagnosis not present

## 2019-08-10 DIAGNOSIS — G4733 Obstructive sleep apnea (adult) (pediatric): Secondary | ICD-10-CM | POA: Diagnosis not present

## 2019-08-15 DIAGNOSIS — M47819 Spondylosis without myelopathy or radiculopathy, site unspecified: Secondary | ICD-10-CM | POA: Diagnosis not present

## 2019-08-15 DIAGNOSIS — Z6841 Body Mass Index (BMI) 40.0 and over, adult: Secondary | ICD-10-CM | POA: Diagnosis not present

## 2019-08-15 DIAGNOSIS — M4699 Unspecified inflammatory spondylopathy, multiple sites in spine: Secondary | ICD-10-CM | POA: Diagnosis not present

## 2019-08-15 DIAGNOSIS — I10 Essential (primary) hypertension: Secondary | ICD-10-CM | POA: Diagnosis not present

## 2019-08-23 ENCOUNTER — Telehealth: Payer: Self-pay | Admitting: *Deleted

## 2019-08-23 NOTE — Telephone Encounter (Addendum)
Prior Auth for Janumet 50-1000mg -APPROVED 08/23/2019 - 08/17/2020  PA# [id number]  Confirmation #:[id number]  [facility] portal  Pharmacy notified.

## 2019-09-10 DIAGNOSIS — G4733 Obstructive sleep apnea (adult) (pediatric): Secondary | ICD-10-CM | POA: Diagnosis not present

## 2019-09-11 ENCOUNTER — Other Ambulatory Visit: Payer: Self-pay | Admitting: Family Medicine

## 2019-09-12 ENCOUNTER — Encounter: Payer: Self-pay | Admitting: Family

## 2019-09-12 ENCOUNTER — Ambulatory Visit (INDEPENDENT_AMBULATORY_CARE_PROVIDER_SITE_OTHER): Payer: Medicaid Other | Admitting: Family

## 2019-09-12 DIAGNOSIS — J4521 Mild intermittent asthma with (acute) exacerbation: Secondary | ICD-10-CM

## 2019-09-12 MED ORDER — AZITHROMYCIN 250 MG PO TABS: ORAL_TABLET | ORAL | 0 refills | Status: DC

## 2019-09-12 MED ORDER — PROMETHAZINE-DM 6.25-15 MG/5ML PO SYRP: 5.0000 mL | ORAL_SOLUTION | Freq: Three times a day (TID) | ORAL | 0 refills | Status: DC | PRN

## 2019-09-12 MED ORDER — MONTELUKAST SODIUM 10 MG PO TABS: 10.0000 mg | ORAL_TABLET | Freq: Every day | ORAL | 1 refills | Status: DC

## 2019-09-12 NOTE — Progress Notes (Signed)
Virtual Visit via telephone Note Due to COVID-19 pandemic this visit was conducted virtually. This visit type was conducted due to national recommendations for restrictions regarding the COVID-19 Pandemic (e.g. social distancing, sheltering in place) in an effort to limit this patient's exposure and mitigate transmission in our community. All issues noted in this document were discussed and addressed.  A physical exam was not performed with this format.  I connected with Kevin Pugh on 09/12/19 at 9:32 AM  by telephone and verified that I am speaking with the correct person using two identifiers. Kevin Pugh is currently located at home and no one is currently with him during visit. The provider, Evelina Dun, FNP is located in their office at time of visit.  I discussed the limitations, risks, security and privacy concerns of performing an evaluation and management service by telephone and the availability of in person appointments. I also discussed with the patient that there may be a patient responsible charge related to this service. The patient expressed understanding and agreed to proceed.   History and Present Illness:  Cough This is a new problem. The current episode started yesterday. The problem has been gradually worsening. The cough is productive of purulent sputum. Associated symptoms include headaches ("after coughing a lot"), nasal congestion and postnasal drip. Pertinent negatives include no chills, ear congestion, ear pain, fever, myalgias, sore throat, shortness of breath or wheezing. The symptoms are aggravated by lying down. He has tried rest and OTC cough suppressant for the symptoms. The treatment provided mild relief. His past medical history is significant for asthma and environmental allergies.      Review of Systems  Constitutional: Negative for chills and fever.  HENT: Positive for postnasal drip. Negative for ear pain and sore throat.   Respiratory: Positive  for cough. Negative for shortness of breath and wheezing.   Musculoskeletal: Negative for myalgias.  Neurological: Positive for headaches ("after coughing a lot").  Endo/Heme/Allergies: Positive for environmental allergies.  All other systems reviewed and are negative.    Observations/Objective: No SOB or distress noted , intermittent dry cough  Assessment and Plan: 1. Mild intermittent asthma with acute exacerbation Will start Singulair with zyrtec Pt has allergy to prednisone Continue to use albuterol as needed Avoid allergens  Keep chronic follow up with PCP   - montelukast (SINGULAIR) 10 MG tablet; Take 1 tablet (10 mg total) by mouth at bedtime.  Dispense: 90 tablet; Refill: 1 - azithromycin (ZITHROMAX) 250 MG tablet; Take 500 mg once, then 250 mg for four days  Dispense: 6 tablet; Refill: 0 - promethazine-dextromethorphan (PROMETHAZINE-DM) 6.25-15 MG/5ML syrup; Take 5 mLs by mouth 3 (three) times daily as needed for cough.  Dispense: 118 mL; Refill: 0      I discussed the assessment and treatment plan with the patient. The patient was provided an opportunity to ask questions and all were answered. The patient agreed with the plan and demonstrated an understanding of the instructions.   The patient was advised to call back or seek an in-person evaluation if the symptoms worsen or if the condition fails to improve as anticipated.  The above assessment and management plan was discussed with the patient. The patient verbalized understanding of and has agreed to the management plan. Patient is aware to call the clinic if symptoms persist or worsen. Patient is aware when to return to the clinic for a follow-up visit. Patient educated on when it is appropriate to go to the emergency department.  Time call ended: 9:47 AM  I provided 15 minutes of non-face-to-face time during this encounter.    Jannifer Rodney, FNP

## 2019-10-10 DIAGNOSIS — G4733 Obstructive sleep apnea (adult) (pediatric): Secondary | ICD-10-CM | POA: Diagnosis not present

## 2019-10-13 DIAGNOSIS — Z6841 Body Mass Index (BMI) 40.0 and over, adult: Secondary | ICD-10-CM | POA: Diagnosis not present

## 2019-10-13 DIAGNOSIS — I10 Essential (primary) hypertension: Secondary | ICD-10-CM | POA: Diagnosis not present

## 2019-11-10 DIAGNOSIS — G4733 Obstructive sleep apnea (adult) (pediatric): Secondary | ICD-10-CM | POA: Diagnosis not present

## 2019-12-11 DIAGNOSIS — G4733 Obstructive sleep apnea (adult) (pediatric): Secondary | ICD-10-CM | POA: Diagnosis not present

## 2019-12-29 ENCOUNTER — Other Ambulatory Visit: Payer: Self-pay | Admitting: Family Medicine

## 2020-01-08 DIAGNOSIS — I10 Essential (primary) hypertension: Secondary | ICD-10-CM | POA: Diagnosis not present

## 2020-01-08 DIAGNOSIS — G4733 Obstructive sleep apnea (adult) (pediatric): Secondary | ICD-10-CM | POA: Diagnosis not present

## 2020-01-08 DIAGNOSIS — Z6841 Body Mass Index (BMI) 40.0 and over, adult: Secondary | ICD-10-CM | POA: Diagnosis not present

## 2020-01-15 DIAGNOSIS — G4733 Obstructive sleep apnea (adult) (pediatric): Secondary | ICD-10-CM | POA: Diagnosis not present

## 2020-02-08 DIAGNOSIS — G4733 Obstructive sleep apnea (adult) (pediatric): Secondary | ICD-10-CM | POA: Diagnosis not present

## 2020-03-07 ENCOUNTER — Other Ambulatory Visit: Payer: Self-pay | Admitting: Family Medicine

## 2020-03-09 DIAGNOSIS — G4733 Obstructive sleep apnea (adult) (pediatric): Secondary | ICD-10-CM | POA: Diagnosis not present

## 2020-03-17 ENCOUNTER — Other Ambulatory Visit: Payer: Self-pay | Admitting: Family Medicine

## 2020-03-18 ENCOUNTER — Telehealth: Payer: Self-pay | Admitting: Family Medicine

## 2020-03-18 ENCOUNTER — Other Ambulatory Visit: Payer: Self-pay | Admitting: Family Medicine

## 2020-03-18 MED ORDER — ROSUVASTATIN CALCIUM 20 MG PO TABS: 20.0000 mg | ORAL_TABLET | Freq: Every day | ORAL | 0 refills | Status: DC

## 2020-03-18 NOTE — Telephone Encounter (Signed)
One month refill sent to pharmacy.

## 2020-03-18 NOTE — Telephone Encounter (Signed)
  Prescription Request  03/18/2020  What is the name of the medication or equipment? rosuvastatin (CRESTOR) 20 MG tablet  lisinopril (ZESTRIL) 20 MG tablet   Have you contacted your pharmacy to request a refill? (if applicable) Yes  Which pharmacy would you like this sent to? [facility] [place], pt has appt on 04/19/20 with Dr. Louanne Skye he has ran out    Patient notified that their request is being sent to the clinical staff for review and that they should receive a response within 2 business days.

## 2020-03-30 ENCOUNTER — Other Ambulatory Visit: Payer: Self-pay | Admitting: Family Medicine

## 2020-03-31 ENCOUNTER — Other Ambulatory Visit: Payer: Self-pay | Admitting: Family Medicine

## 2020-04-01 DIAGNOSIS — M47819 Spondylosis without myelopathy or radiculopathy, site unspecified: Secondary | ICD-10-CM | POA: Diagnosis not present

## 2020-04-01 DIAGNOSIS — M4699 Unspecified inflammatory spondylopathy, multiple sites in spine: Secondary | ICD-10-CM | POA: Diagnosis not present

## 2020-04-09 ENCOUNTER — Other Ambulatory Visit: Payer: Self-pay | Admitting: Family Medicine

## 2020-04-09 NOTE — Telephone Encounter (Signed)
Patient has upcoming apt- 1 month supply sent in.

## 2020-04-19 ENCOUNTER — Other Ambulatory Visit: Payer: Self-pay

## 2020-04-19 ENCOUNTER — Ambulatory Visit: Payer: Medicaid Other | Admitting: Family Medicine

## 2020-04-19 ENCOUNTER — Telehealth: Payer: Self-pay | Admitting: *Deleted

## 2020-04-19 ENCOUNTER — Encounter: Payer: Self-pay | Admitting: Family Medicine

## 2020-04-19 VITALS — BP 134/90 | HR 113 | Temp 98.5°F | Ht 73.0 in | Wt 386.2 lb

## 2020-04-19 DIAGNOSIS — I10 Essential (primary) hypertension: Secondary | ICD-10-CM | POA: Diagnosis not present

## 2020-04-19 DIAGNOSIS — Z23 Encounter for immunization: Secondary | ICD-10-CM

## 2020-04-19 DIAGNOSIS — E1169 Type 2 diabetes mellitus with other specified complication: Secondary | ICD-10-CM | POA: Diagnosis not present

## 2020-04-19 DIAGNOSIS — E785 Hyperlipidemia, unspecified: Secondary | ICD-10-CM

## 2020-04-19 DIAGNOSIS — M109 Gout, unspecified: Secondary | ICD-10-CM

## 2020-04-19 LAB — BAYER DCA HB A1C WAIVED: HB A1C (BAYER DCA - WAIVED): 7.1 % — ABNORMAL HIGH (ref <7.0)

## 2020-04-19 MED ORDER — FEBUXOSTAT 40 MG PO TABS: 40.0000 mg | ORAL_TABLET | Freq: Every day | ORAL | 3 refills | Status: DC

## 2020-04-19 MED ORDER — JANUMET 50-1000 MG PO TABS: 1.0000 | ORAL_TABLET | Freq: Two times a day (BID) | ORAL | 3 refills | Status: DC

## 2020-04-19 MED ORDER — COLCHICINE 0.6 MG PO CAPS: 1.0000 | ORAL_CAPSULE | Freq: Every day | ORAL | 3 refills | Status: DC

## 2020-04-19 MED ORDER — ROSUVASTATIN CALCIUM 20 MG PO TABS: 20.0000 mg | ORAL_TABLET | Freq: Every day | ORAL | 3 refills | Status: DC

## 2020-04-19 MED ORDER — LISINOPRIL 20 MG PO TABS: 20.0000 mg | ORAL_TABLET | Freq: Every day | ORAL | 3 refills | Status: DC

## 2020-04-19 MED ORDER — AMLODIPINE BESYLATE 10 MG PO TABS: 10.0000 mg | ORAL_TABLET | Freq: Every day | ORAL | 3 refills | Status: DC

## 2020-04-19 NOTE — Progress Notes (Signed)
BP 134/90   Pulse (!) 113   Temp 98.5 F (36.9 C) (Temporal)   Ht '6\' 1"'$  (1.854 m)   Wt (!) 386 lb 4 oz (175.2 kg)   BMI 50.96 kg/m    Subjective:   Patient ID: Kevin Pugh, male    DOB: Apr 15, 1981, 39 y.o.   MRN: [id number]  HPI: Kevin Pugh is a 39 y.o. male presenting on 04/19/2020 for Medical Management of Chronic Issues   HPI Type 2 diabetes mellitus Patient comes in today for recheck of his diabetes. Patient has been currently taking Janumet. Patient is currently on an ACE inhibitor/ARB. Patient has not seen an ophthalmologist this year. Patient denies any issues with their feet. The symptom started onset as an adult hypertension and cholesterol ARE RELATED TO DM   Hypertension Patient is currently on lisinopril and amlodipine, and their blood pressure today is 134/90. Patient denies any lightheadedness or dizziness. Patient denies headaches, blurred vision, chest pains, shortness of breath, or weakness. Denies any side effects from medication and is content with current medication.   Hyperlipidemia Patient is coming in for recheck of his hyperlipidemia. The patient is currently taking fish oil and Crestor. They deny any issues with myalgias or history of liver damage from it. They deny any focal numbness or weakness or chest pain.   Gout Last attack: last week Attacks this year: multiple Medication: Colchicine daily, failed allopurinol previously Location of attacks: Feet and knees and toes at different occasions.  Relevant past medical, surgical, family and social history reviewed and updated as indicated. Interim medical history since our last visit reviewed. Allergies and medications reviewed and updated.  Review of Systems  Constitutional: Negative for chills and fever.  Respiratory: Negative for shortness of breath and wheezing.   Cardiovascular: Negative for chest pain and leg swelling.  Musculoskeletal: Negative for back pain and gait problem.  Skin:  Negative for rash.  Neurological: Negative for dizziness, weakness and numbness.  All other systems reviewed and are negative.   Per HPI unless specifically indicated above   Allergies as of 04/19/2020      Reactions   Prednisone Anaphylaxis, Hives, Swelling   Keflex [cephalexin Monohydrate]    Like thrush on the outside of mouth      Medication List       Accurate as of April 19, 2020  1:24 PM. If you have any questions, ask your nurse or doctor.        STOP taking these medications   azithromycin 250 MG tablet Commonly known as: ZITHROMAX Stopped by: Fransisca Kaufmann Malikai Gut, MD   montelukast 10 MG tablet Commonly known as: SINGULAIR Stopped by: Worthy Rancher, MD   promethazine-dextromethorphan 6.25-15 MG/5ML syrup Commonly known as: PROMETHAZINE-DM Stopped by: Fransisca Kaufmann Araeya Lamb, MD     TAKE these medications   albuterol 108 (90 Base) MCG/ACT inhaler Commonly known as: VENTOLIN HFA Inhale 2 puffs into the lungs every 6 (six) hours as needed. For shortness of breath   amLODipine 10 MG tablet Commonly known as: NORVASC Take 1 tablet (10 mg total) by mouth daily.   cetirizine 10 MG tablet Commonly known as: ZYRTEC Take 10 mg by mouth at bedtime.   cholecalciferol 1000 units tablet Commonly known as: VITAMIN D Take 1,000 Units by mouth daily.   diclofenac 75 MG EC tablet Commonly known as: VOLTAREN Take 1 tablet (75 mg total) by mouth 2 (two) times daily. FOR LOWER BACK PAIN   fish oil-omega-3 fatty acids  1000 MG capsule Take 1 g by mouth daily.   Janumet 50-1000 MG tablet Generic drug: sitaGLIPtin-metformin Take 1 tablet by mouth 2 (two) times daily with a meal.   lisinopril 20 MG tablet Commonly known as: ZESTRIL TAKE 1 TABLET (20 MG TOTAL) BY MOUTH DAILY. (NEEDS TO BE SEEN BEFORE NEXT REFILL)   Mitigare 0.6 MG Caps Generic drug: Colchicine TAKE 1 CAPSULE BY MOUTH EVERY DAY   rosuvastatin 20 MG tablet Commonly known as: CRESTOR Take 1 tablet (20  mg total) by mouth daily.        Objective:   BP 134/90   Pulse (!) 113   Temp 98.5 F (36.9 C) (Temporal)   Ht '6\' 1"'$  (1.854 m)   Wt (!) 386 lb 4 oz (175.2 kg)   BMI 50.96 kg/m   Wt Readings from Last 3 Encounters:  04/19/20 (!) 386 lb 4 oz (175.2 kg)  05/25/18 (!) 403 lb (182.8 kg)  04/21/18 (!) 405 lb (183.7 kg)    Physical Exam Vitals and nursing note reviewed.  Constitutional:      General: He is not in acute distress.    Appearance: He is well-developed. He is not diaphoretic.  Eyes:     General: No scleral icterus.    Conjunctiva/sclera: Conjunctivae normal.  Neck:     Thyroid: No thyromegaly.  Cardiovascular:     Rate and Rhythm: Normal rate and regular rhythm.     Heart sounds: Normal heart sounds. No murmur heard.   Pulmonary:     Effort: Pulmonary effort is normal. No respiratory distress.     Breath sounds: Normal breath sounds. No wheezing.  Musculoskeletal:        General: Normal range of motion.     Cervical back: Neck supple.  Lymphadenopathy:     Cervical: No cervical adenopathy.  Skin:    General: Skin is warm and dry.     Findings: No rash.  Neurological:     Mental Status: He is alert and oriented to person, place, and time.     Coordination: Coordination normal.  Psychiatric:        Behavior: Behavior normal.       Assessment & Plan:   Problem List Items Addressed This Visit      Cardiovascular and Mediastinum   Essential hypertension   Relevant Medications   amLODipine (NORVASC) 10 MG tablet   lisinopril (ZESTRIL) 20 MG tablet   rosuvastatin (CRESTOR) 20 MG tablet   Other Relevant Orders   CMP14+EGFR     Endocrine   Type 2 diabetes mellitus with other specified complication (HCC) - Primary   Relevant Medications   lisinopril (ZESTRIL) 20 MG tablet   rosuvastatin (CRESTOR) 20 MG tablet   sitaGLIPtin-metformin (JANUMET) 50-1000 MG tablet   Other Relevant Orders   Microalbumin / creatinine urine ratio   Bayer DCA Hb A1c  Waived   CBC with Differential/Platelet   CMP14+EGFR   Hyperlipidemia associated with type 2 diabetes mellitus (HCC)   Relevant Medications   lisinopril (ZESTRIL) 20 MG tablet   rosuvastatin (CRESTOR) 20 MG tablet   sitaGLIPtin-metformin (JANUMET) 50-1000 MG tablet   Other Relevant Orders   Lipid panel     Other   Gout   Relevant Orders   Uric acid      Will try Uloric, the colchicine every day is not working as preventative and he is previously tried allopurinol in the was worse than the colchicine.  A1c 7.1, continue current medication focus on  diet and exercise. Follow up plan: Return in about 3 months (around 07/20/2020), or if symptoms worsen or fail to improve, for Diabetes and hypertension.  Counseling provided for all of the vaccine components Orders Placed This Encounter  Procedures  . Microalbumin / creatinine urine ratio  . Bayer [facility] Hb A1c [place], MD [facility] 04/19/2020, 1:24 PM

## 2020-04-19 NOTE — Telephone Encounter (Signed)
Febuxostat APPROVED through [facility] for 365 days

## 2020-04-19 NOTE — Addendum Note (Signed)
Addended by: Hessie Diener on: 04/19/2020 01:56 PM   Modules accepted: Orders

## 2020-04-20 LAB — CMP14+EGFR
ALT: 39 IU/L (ref 0–44)
AST: 48 IU/L — ABNORMAL HIGH (ref 0–40)
Albumin/Globulin Ratio: 1.9 (ref 1.2–2.2)
Albumin: 4.7 g/dL (ref 4.0–5.0)
Alkaline Phosphatase: 84 IU/L (ref 48–121)
BUN/Creatinine Ratio: 15 (ref 9–20)
BUN: 13 mg/dL (ref 6–24)
Bilirubin Total: 0.5 mg/dL (ref 0.0–1.2)
CO2: 18 mmol/L — ABNORMAL LOW (ref 20–29)
Calcium: 9.9 mg/dL (ref 8.7–10.2)
Chloride: 100 mmol/L (ref 96–106)
Creatinine, Ser: 0.88 mg/dL (ref 0.76–1.27)
GFR calc Af Amer: 124 mL/min/{1.73_m2} (ref >59)
GFR calc non Af Amer: 107 mL/min/{1.73_m2} (ref >59)
Globulin, Total: 2.5 g/dL (ref 1.5–4.5)
Glucose: 255 mg/dL — ABNORMAL HIGH (ref 65–99)
Potassium: 4.3 mmol/L (ref 3.5–5.2)
Sodium: 135 mmol/L (ref 134–144)
Total Protein: 7.2 g/dL (ref 6.0–8.5)

## 2020-04-20 LAB — CBC WITH DIFFERENTIAL/PLATELET
Basophils Absolute: 0.1 10*3/uL (ref 0.0–0.2)
Basos: 1 %
EOS (ABSOLUTE): 0.1 10*3/uL (ref 0.0–0.4)
Eos: 2 %
Hematocrit: 47.1 % (ref 37.5–51.0)
Hemoglobin: 15.9 g/dL (ref 13.0–17.7)
Immature Grans (Abs): 0 10*3/uL (ref 0.0–0.1)
Immature Granulocytes: 0 %
Lymphocytes Absolute: 2 10*3/uL (ref 0.7–3.1)
Lymphs: 29 %
MCH: 30.5 pg (ref 26.6–33.0)
MCHC: 33.8 g/dL (ref 31.5–35.7)
MCV: 90 fL (ref 79–97)
Monocytes Absolute: 0.5 10*3/uL (ref 0.1–0.9)
Monocytes: 7 %
Neutrophils Absolute: 4.1 10*3/uL (ref 1.4–7.0)
Neutrophils: 61 %
Platelets: 245 10*3/uL (ref 150–450)
RBC: 5.22 x10E6/uL (ref 4.14–5.80)
RDW: 13.2 % (ref 11.6–15.4)
WBC: 6.8 10*3/uL (ref 3.4–10.8)

## 2020-04-20 LAB — MICROALBUMIN / CREATININE URINE RATIO
Creatinine, Urine: 160.3 mg/dL
Microalb/Creat Ratio: 6 mg/g creat (ref 0–29)
Microalbumin, Urine: 9.5 ug/mL

## 2020-04-20 LAB — LIPID PANEL
Chol/HDL Ratio: 5.3 ratio — ABNORMAL HIGH (ref 0.0–5.0)
Cholesterol, Total: 179 mg/dL (ref 100–199)
HDL: 34 mg/dL — ABNORMAL LOW (ref >39)
LDL Chol Calc (NIH): 80 mg/dL (ref 0–99)
Triglycerides: 403 mg/dL — ABNORMAL HIGH (ref 0–149)
VLDL Cholesterol Cal: 65 mg/dL — ABNORMAL HIGH (ref 5–40)

## 2020-04-20 LAB — URIC ACID: Uric Acid: 7.3 mg/dL (ref 3.8–8.4)

## 2020-04-22 ENCOUNTER — Telehealth: Payer: Self-pay

## 2020-04-22 NOTE — Telephone Encounter (Signed)
Key[facility]arian Sorrow) - [phone] Need help? Call us at [phone]  Status Sent to [employer] The plan will fax you a determination, typically within 1 to 5 business days.  How do I follow up? Drug Febuxostat 40MG  tablets Form [facility] Prior Approval Request for Standard Drug Request Form Pharmacy Prior Approval Request for Standard Drug Request Form for [place] Medicaid and [place] [employer] [phone] Original Claim Info

## 2020-04-23 ENCOUNTER — Other Ambulatory Visit: Payer: Self-pay | Admitting: *Deleted

## 2020-04-24 NOTE — Telephone Encounter (Signed)
PA sent into [employer] Confirmation # [id number] prior approval [id number].   Pa Approved 04/24/2020-04/24/2021. Pharmacy and patient aware.

## 2020-06-20 DIAGNOSIS — G4733 Obstructive sleep apnea (adult) (pediatric): Secondary | ICD-10-CM | POA: Diagnosis not present

## 2020-07-11 DIAGNOSIS — G4733 Obstructive sleep apnea (adult) (pediatric): Secondary | ICD-10-CM | POA: Diagnosis not present

## 2020-07-18 ENCOUNTER — Ambulatory Visit: Payer: Medicaid Other | Admitting: Family Medicine

## 2020-09-12 DIAGNOSIS — G4733 Obstructive sleep apnea (adult) (pediatric): Secondary | ICD-10-CM | POA: Diagnosis not present

## 2020-12-05 DIAGNOSIS — G4733 Obstructive sleep apnea (adult) (pediatric): Secondary | ICD-10-CM | POA: Diagnosis not present

## 2021-02-15 ENCOUNTER — Other Ambulatory Visit: Payer: Self-pay | Admitting: Family Medicine

## 2021-02-26 DIAGNOSIS — M4699 Unspecified inflammatory spondylopathy, multiple sites in spine: Secondary | ICD-10-CM | POA: Diagnosis not present

## 2021-02-26 DIAGNOSIS — M47819 Spondylosis without myelopathy or radiculopathy, site unspecified: Secondary | ICD-10-CM | POA: Diagnosis not present

## 2021-03-20 ENCOUNTER — Other Ambulatory Visit: Payer: Self-pay | Admitting: Family Medicine

## 2021-03-20 NOTE — Telephone Encounter (Signed)
[place]. NTBS 30 days given 02/17/21

## 2021-04-23 ENCOUNTER — Other Ambulatory Visit: Payer: Self-pay | Admitting: Family Medicine

## 2021-05-09 ENCOUNTER — Other Ambulatory Visit: Payer: Self-pay | Admitting: Family Medicine

## 2021-05-12 ENCOUNTER — Other Ambulatory Visit: Payer: Self-pay | Admitting: Family Medicine

## 2021-05-13 ENCOUNTER — Telehealth: Payer: Self-pay | Admitting: Family Medicine

## 2021-05-13 NOTE — Telephone Encounter (Signed)
  Prescription Request  05/13/2021  What is the name of the medication or equipment? Amlodipine 10 mg, Colchicine 0.6 mg caps Patient has appt 7-19 and needs a 30 day supply called in. Medications were refused   Have you contacted your pharmacy to request a refill? (if applicable) YES  [facility] [place]   Patient notified that their request is being sent to the clinical staff for review and that they should receive a response within 2 business days.

## 2021-05-14 MED ORDER — COLCHICINE 0.6 MG PO CAPS: 1.0000 | ORAL_CAPSULE | Freq: Every day | ORAL | 0 refills | Status: DC

## 2021-05-14 MED ORDER — AMLODIPINE BESYLATE 10 MG PO TABS: 10.0000 mg | ORAL_TABLET | Freq: Every day | ORAL | 0 refills | Status: DC

## 2021-05-14 NOTE — Telephone Encounter (Signed)
30 DAY SUPPLY SENT TO PHARMACY.

## 2021-05-16 ENCOUNTER — Telehealth: Payer: Self-pay

## 2021-05-16 NOTE — Telephone Encounter (Signed)
Kevin Pugh with pt insurance plan called asking if pt could be switched to Mitigare 0.6.  States that this medication will be covered.

## 2021-05-16 NOTE — Telephone Encounter (Signed)
Key: [id number] Covermymeds  PA initiated for Colchicine 0.6  Patient has been taking medication for years for gout.

## 2021-05-19 MED ORDER — MITIGARE 0.6 MG PO CAPS: 1.0000 | ORAL_CAPSULE | Freq: Every day | ORAL | 0 refills | Status: DC

## 2021-05-19 NOTE — Telephone Encounter (Signed)
Sent as mitigare

## 2021-05-26 ENCOUNTER — Other Ambulatory Visit: Payer: Self-pay | Admitting: Family Medicine

## 2021-05-27 ENCOUNTER — Ambulatory Visit: Payer: Medicaid Other | Admitting: Family Medicine

## 2021-05-27 ENCOUNTER — Other Ambulatory Visit: Payer: Self-pay

## 2021-05-27 ENCOUNTER — Encounter: Payer: Self-pay | Admitting: Family Medicine

## 2021-05-27 VITALS — BP 135/95 | HR 110 | Ht 73.0 in | Wt 370.0 lb

## 2021-05-27 DIAGNOSIS — E1169 Type 2 diabetes mellitus with other specified complication: Secondary | ICD-10-CM | POA: Diagnosis not present

## 2021-05-27 DIAGNOSIS — E785 Hyperlipidemia, unspecified: Secondary | ICD-10-CM | POA: Diagnosis not present

## 2021-05-27 DIAGNOSIS — I10 Essential (primary) hypertension: Secondary | ICD-10-CM

## 2021-05-27 DIAGNOSIS — M109 Gout, unspecified: Secondary | ICD-10-CM | POA: Diagnosis not present

## 2021-05-27 LAB — BAYER DCA HB A1C WAIVED: HB A1C (BAYER DCA - WAIVED): 6.7 % (ref <7.0)

## 2021-05-27 MED ORDER — AMLODIPINE BESYLATE 10 MG PO TABS: 10.0000 mg | ORAL_TABLET | Freq: Every day | ORAL | 3 refills | Status: DC

## 2021-05-27 MED ORDER — MITIGARE 0.6 MG PO CAPS: 1.0000 | ORAL_CAPSULE | Freq: Every day | ORAL | 3 refills | Status: DC

## 2021-05-27 MED ORDER — JANUMET 50-1000 MG PO TABS: 1.0000 | ORAL_TABLET | Freq: Two times a day (BID) | ORAL | 3 refills | Status: DC

## 2021-05-27 NOTE — Progress Notes (Signed)
 BP (!) 135/95   Pulse (!) 110   Ht 6' 1" (1.854 m)   Wt (!) 370 lb (167.8 kg)   SpO2 97%   BMI 48.82 kg/m    Subjective:   Patient ID: Kevin Pugh, male    DOB: 01/10/1981, 40 y.o.   MRN: [id number]  HPI: Kevin Pugh is a 40 y.o. male presenting on 05/27/2021 for Medical Management of Chronic Issues, Hypertension, Hyperlipidemia, Diabetes, Gout, and COPD   HPI Type 2 diabetes mellitus Patient comes in today for recheck of his diabetes. Patient has been currently taking Janumet, A1c 6.7. Patient is currently on an ACE inhibitor/ARB. Patient has not seen an ophthalmologist this year. Patient denies any issues with their feet except gout. The symptom started onset as an adult hypertension and hyperlipidemia ARE RELATED TO DM   Gout Last attack: Currently getting over 1 in his right pinky toe and left knee, ran out of colchicine Attacks this year: 3 Medication: Tried Uloric but felt like he had more attacks on it so now is back to just taking colchicine every day Location of attacks: Usually right foot sometimes into the knees  Hypertension Patient is currently on lisinopril and amlodipine, and their blood pressure today is 135/95, usually runs better at home. Patient denies any lightheadedness or dizziness. Patient denies headaches, blurred vision, chest pains, shortness of breath, or weakness. Denies any side effects from medication and is content with current medication.   Hyperlipidemia Patient is coming in for recheck of his hyperlipidemia. The patient is currently taking fish oil and Crestor. They deny any issues with myalgias or history of liver damage from it. They deny any focal numbness or weakness or chest pain.   Relevant past medical, surgical, family and social history reviewed and updated as indicated. Interim medical history since our last visit reviewed. Allergies and medications reviewed and updated.  Review of Systems  Constitutional:  Negative for chills and  fever.  Respiratory:  Negative for shortness of breath and wheezing.   Cardiovascular:  Negative for chest pain and leg swelling.  Musculoskeletal:  Positive for arthralgias and joint swelling. Negative for back pain and gait problem.  Skin:  Negative for color change and rash.  All other systems reviewed and are negative.  Per HPI unless specifically indicated above   Allergies as of 05/27/2021       Reactions   Prednisone Anaphylaxis, Hives, Swelling   Keflex [cephalexin Monohydrate]    Like thrush on the outside of mouth        Medication List        Accurate as of May 27, 2021  3:53 PM. If you have any questions, ask your nurse or doctor.          albuterol 108 (90 Base) MCG/ACT inhaler Commonly known as: VENTOLIN HFA Inhale 2 puffs into the lungs every 6 (six) hours as needed. For shortness of breath   amLODipine 10 MG tablet Commonly known as: NORVASC Take 1 tablet (10 mg total) by mouth daily.   cetirizine 10 MG tablet Commonly known as: ZYRTEC Take 10 mg by mouth at bedtime.   cholecalciferol 1000 units tablet Commonly known as: VITAMIN D Take 1,000 Units by mouth daily.   diclofenac 75 MG EC tablet Commonly known as: VOLTAREN Take 1 tablet (75 mg total) by mouth 2 (two) times daily. FOR LOWER BACK PAIN   febuxostat 40 MG tablet Commonly known as: Uloric Take 1 tablet (40 mg total) by   mouth daily.   fish oil-omega-3 fatty acids 1000 MG capsule Take 1 g by mouth daily.   Janumet 50-1000 MG tablet Generic drug: sitaGLIPtin-metformin TAKE 1 TABLET BY MOUTH 2 (TWO) TIMES DAILY WITH A MEAL.   lisinopril 20 MG tablet Commonly known as: ZESTRIL TAKE 1 TABLET (20 MG TOTAL) BY MOUTH DAILY. (NEEDS TO BE SEEN BEFORE NEXT REFILL)   Mitigare 0.6 MG Caps Generic drug: Colchicine Take 1 tablet by mouth daily. (NEEDS TO BE SEEN BEFORE NEXT REFILL)   rosuvastatin 20 MG tablet Commonly known as: CRESTOR TAKE 1 TABLET BY MOUTH EVERY DAY          Objective:   BP (!) 135/95   Pulse (!) 110   Ht 6' 1" (1.854 m)   Wt (!) 370 lb (167.8 kg)   SpO2 97%   BMI 48.82 kg/m   Wt Readings from Last 3 Encounters:  05/27/21 (!) 370 lb (167.8 kg)  04/19/20 (!) 386 lb 4 oz (175.2 kg)  05/25/18 (!) 403 lb (182.8 kg)    Physical Exam Vitals and nursing note reviewed.  Constitutional:      General: He is not in acute distress.    Appearance: He is well-developed. He is not diaphoretic.  Eyes:     General: No scleral icterus.    Conjunctiva/sclera: Conjunctivae normal.  Neck:     Thyroid: No thyromegaly.  Cardiovascular:     Rate and Rhythm: Normal rate and regular rhythm.     Heart sounds: Normal heart sounds. No murmur heard. Pulmonary:     Effort: Pulmonary effort is normal. No respiratory distress.     Breath sounds: Normal breath sounds. No wheezing.  Musculoskeletal:        General: Normal range of motion.     Cervical back: Neck supple.  Lymphadenopathy:     Cervical: No cervical adenopathy.  Skin:    General: Skin is warm and dry.     Findings: No rash.  Neurological:     Mental Status: He is alert and oriented to person, place, and time.     Coordination: Coordination normal.  Psychiatric:        Behavior: Behavior normal.      Assessment & Plan:   Problem List Items Addressed This Visit       Cardiovascular and Mediastinum   Essential hypertension   Relevant Orders   CBC with Differential/Platelet   CMP14+EGFR   Lipid panel   Uric acid   Bayer DCA Hb A1c Waived     Endocrine   Type 2 diabetes mellitus with other specified complication (HCC) - Primary   Relevant Orders   CBC with Differential/Platelet   CMP14+EGFR   Lipid panel   Uric acid   Bayer DCA Hb A1c Waived   Hyperlipidemia associated with type 2 diabetes mellitus (HCC)   Relevant Orders   CBC with Differential/Platelet   CMP14+EGFR   Lipid panel   Uric acid   Bayer DCA Hb A1c Waived     Other   Gout   Relevant Orders   Uric  acid    Continue current medication, will check blood work, A1c looks good at 6.7. Follow up plan: Return in about 6 months (around 11/27/2021), or if symptoms worsen or fail to improve, for Hypertension and cholesterol and gout.  Counseling provided for all of the vaccine components Orders Placed This Encounter  Procedures   CBC with Differential/Platelet   CMP14+EGFR   Lipid panel   Uric acid     Bayer [facility] Hb A1c [place], MD [facility] 05/27/2021, 3:53 PM

## 2021-05-28 LAB — CMP14+EGFR
ALT: 27 IU/L (ref 0–44)
AST: 22 IU/L (ref 0–40)
Albumin/Globulin Ratio: 1.8 (ref 1.2–2.2)
Albumin: 4.4 g/dL (ref 4.0–5.0)
Alkaline Phosphatase: 77 IU/L (ref 44–121)
BUN/Creatinine Ratio: 9 (ref 9–20)
BUN: 8 mg/dL (ref 6–24)
Bilirubin Total: 0.6 mg/dL (ref 0.0–1.2)
CO2: 22 mmol/L (ref 20–29)
Calcium: 9.7 mg/dL (ref 8.7–10.2)
Chloride: 105 mmol/L (ref 96–106)
Creatinine, Ser: 0.93 mg/dL (ref 0.76–1.27)
Globulin, Total: 2.4 g/dL (ref 1.5–4.5)
Glucose: 139 mg/dL — ABNORMAL HIGH (ref 65–99)
Potassium: 4.4 mmol/L (ref 3.5–5.2)
Sodium: 139 mmol/L (ref 134–144)
Total Protein: 6.8 g/dL (ref 6.0–8.5)
eGFR: 106 mL/min/{1.73_m2} (ref >59)

## 2021-05-28 LAB — CBC WITH DIFFERENTIAL/PLATELET
Basophils Absolute: 0 10*3/uL (ref 0.0–0.2)
Basos: 1 %
EOS (ABSOLUTE): 0.1 10*3/uL (ref 0.0–0.4)
Eos: 2 %
Hematocrit: 46.7 % (ref 37.5–51.0)
Hemoglobin: 15.4 g/dL (ref 13.0–17.7)
Immature Grans (Abs): 0 10*3/uL (ref 0.0–0.1)
Immature Granulocytes: 0 %
Lymphocytes Absolute: 2.6 10*3/uL (ref 0.7–3.1)
Lymphs: 39 %
MCH: 29.6 pg (ref 26.6–33.0)
MCHC: 33 g/dL (ref 31.5–35.7)
MCV: 90 fL (ref 79–97)
Monocytes Absolute: 0.5 10*3/uL (ref 0.1–0.9)
Monocytes: 7 %
Neutrophils Absolute: 3.3 10*3/uL (ref 1.4–7.0)
Neutrophils: 51 %
Platelets: 227 10*3/uL (ref 150–450)
RBC: 5.21 x10E6/uL (ref 4.14–5.80)
RDW: 12.9 % (ref 11.6–15.4)
WBC: 6.6 10*3/uL (ref 3.4–10.8)

## 2021-05-28 LAB — LIPID PANEL
Chol/HDL Ratio: 4.7 ratio (ref 0.0–5.0)
Cholesterol, Total: 159 mg/dL (ref 100–199)
HDL: 34 mg/dL — ABNORMAL LOW (ref >39)
LDL Chol Calc (NIH): 61 mg/dL (ref 0–99)
Triglycerides: 413 mg/dL — ABNORMAL HIGH (ref 0–149)
VLDL Cholesterol Cal: 64 mg/dL — ABNORMAL HIGH (ref 5–40)

## 2021-05-28 LAB — URIC ACID: Uric Acid: 7.3 mg/dL (ref 3.8–8.4)

## 2021-05-29 ENCOUNTER — Other Ambulatory Visit: Payer: Self-pay

## 2021-05-29 MED ORDER — FENOFIBRATE 145 MG PO TABS: 145.0000 mg | ORAL_TABLET | Freq: Every day | ORAL | 1 refills | Status: DC

## 2021-06-03 DIAGNOSIS — G4733 Obstructive sleep apnea (adult) (pediatric): Secondary | ICD-10-CM | POA: Diagnosis not present

## 2021-07-15 DIAGNOSIS — M5416 Radiculopathy, lumbar region: Secondary | ICD-10-CM | POA: Diagnosis not present

## 2021-09-02 DIAGNOSIS — G4733 Obstructive sleep apnea (adult) (pediatric): Secondary | ICD-10-CM | POA: Diagnosis not present

## 2021-09-11 ENCOUNTER — Telehealth: Payer: Self-pay | Admitting: Family Medicine

## 2021-09-11 NOTE — Telephone Encounter (Signed)
..  Patient declines further follow up and engagement by the Managed Medicaid Team. Appropriate care team members and provider have been notified via electronic communication. The Managed Medicaid Team is available to follow up with the patient after provider conversation with the patient regarding recommendation for engagement and subsequent re-referral to the Managed Medicaid Team.    Jennifer Alley Care Guide, High Risk Medicaid Managed Care Embedded Care Coordination [place]  [facility]   

## 2021-09-22 ENCOUNTER — Other Ambulatory Visit: Payer: Self-pay | Admitting: Family Medicine

## 2021-10-10 ENCOUNTER — Telehealth: Payer: Self-pay | Admitting: Family Medicine

## 2021-10-10 MED ORDER — COLCHICINE 0.6 MG PO CAPS: 1.0000 | ORAL_CAPSULE | Freq: Every day | ORAL | 3 refills | Status: DC

## 2021-10-10 MED ORDER — COLCHICINE 0.6 MG PO CAPS
1.0000 | ORAL_CAPSULE | Freq: Every day | ORAL | 3 refills | Status: DC
Start: 2021-10-10 — End: 2021-10-10

## 2021-10-10 NOTE — Telephone Encounter (Signed)
PA was archived in Covermymeds. Dettinger's nurse never seen a PA come over.  Pt has medicaid and Mitigare is not a preferred medication.  Is there something else pt can try?

## 2021-10-10 NOTE — Telephone Encounter (Signed)
Pt informed. He will call the pharmacy to see if they have already run it through his insurance. Pt will call back if needed.

## 2021-10-10 NOTE — Telephone Encounter (Signed)
Called pharmacy it needs PA. SPoke with patient and told him we would start on PA

## 2021-10-10 NOTE — Addendum Note (Signed)
Addended by: Magdalene River on: 10/10/2021 01:31 PM   Modules accepted: Orders

## 2021-10-10 NOTE — Telephone Encounter (Signed)
I have sent the generic for him, it is he has insurance forced [place] to do the medic artery because that is what they would cover but maybe they changed and now they are going back to colchicine, I sent the generic colchicine

## 2021-10-14 NOTE — Telephone Encounter (Signed)
[  [id number]]    Order Details Dose: 1 tablet Route: Oral Frequency: Daily  Dispense Quantity: 90 capsule Refills: 3        Sig: Take 1 tablet by mouth daily.       Start Date: 10/10/21 End Date: --  Written Date: 10/10/21 Expiration Date: 10/10/22  Original Order:  Colchicine (MITIGARE) 0.6 MG CAPS [[id number]]  Providers  Ordering and Authorizing Provider:   Dettinger, Elige Radon, MD  [address] [place], [place] [address]  Phone:  [phone]   Fax:  [phone]  DEA #:  [id number]   NPI:  [id number]      Ordering User:  Magdalene River, LPN       Pharmacy  [phone] [place] [place] - [address]  [address] [place], [place] [address]  Phone:  [phone]  Fax:  [phone]  DEA #:  [id number]  DAW Reason:

## 2021-10-24 ENCOUNTER — Telehealth: Payer: Self-pay | Admitting: Family Medicine

## 2021-10-24 NOTE — Telephone Encounter (Signed)
Pt called stating that he spoke with the insurance company and was told to tell PCP that we send Expedited PA for Mitigare Rx and they can get it approved and shipped within 24 hrs.

## 2021-10-28 ENCOUNTER — Other Ambulatory Visit: Payer: Self-pay | Admitting: Family Medicine

## 2021-10-28 ENCOUNTER — Other Ambulatory Visit: Payer: Self-pay

## 2021-10-28 MED ORDER — COLCHICINE 0.6 MG PO CAPS: 1.0000 | ORAL_CAPSULE | Freq: Every day | ORAL | 3 refills | Status: DC

## 2021-10-29 MED ORDER — COLCHICINE 0.6 MG PO TABS: 0.6000 mg | ORAL_TABLET | Freq: Every day | ORAL | 1 refills | Status: DC

## 2021-10-29 NOTE — Telephone Encounter (Signed)
We are having trouble with pt getting the Mitigare (branded colchicine 0.6mg ) Capsules and the  colchicine capsule (generic for Mitigare  Medicaid covers this following:  colchicine tablet (generic for Colcrys)   Please order this way and [facility] to [facility]

## 2021-10-29 NOTE — Telephone Encounter (Signed)
Sent generic for colchicine for the patient

## 2021-11-27 ENCOUNTER — Ambulatory Visit: Payer: Medicaid Other | Admitting: Family Medicine

## 2021-12-01 DIAGNOSIS — G4733 Obstructive sleep apnea (adult) (pediatric): Secondary | ICD-10-CM | POA: Diagnosis not present

## 2021-12-25 ENCOUNTER — Ambulatory Visit: Payer: Medicaid Other | Admitting: Family Medicine

## 2021-12-25 ENCOUNTER — Encounter: Payer: Self-pay | Admitting: Family Medicine

## 2021-12-25 VITALS — BP 133/85 | HR 115 | Ht 73.0 in | Wt 358.0 lb

## 2021-12-25 DIAGNOSIS — I10 Essential (primary) hypertension: Secondary | ICD-10-CM

## 2021-12-25 DIAGNOSIS — M109 Gout, unspecified: Secondary | ICD-10-CM | POA: Diagnosis not present

## 2021-12-25 DIAGNOSIS — E1169 Type 2 diabetes mellitus with other specified complication: Secondary | ICD-10-CM | POA: Diagnosis not present

## 2021-12-25 DIAGNOSIS — E785 Hyperlipidemia, unspecified: Secondary | ICD-10-CM | POA: Diagnosis not present

## 2021-12-25 LAB — CMP14+EGFR
ALT: 43 IU/L (ref 0–44)
AST: 51 IU/L — ABNORMAL HIGH (ref 0–40)
Albumin/Globulin Ratio: 2.4 — ABNORMAL HIGH (ref 1.2–2.2)
Albumin: 4.8 g/dL (ref 4.0–5.0)
Alkaline Phosphatase: 95 IU/L (ref 44–121)
BUN/Creatinine Ratio: 10 (ref 9–20)
BUN: 9 mg/dL (ref 6–24)
Bilirubin Total: 0.9 mg/dL (ref 0.0–1.2)
CO2: 22 mmol/L (ref 20–29)
Calcium: 9.7 mg/dL (ref 8.7–10.2)
Chloride: 99 mmol/L (ref 96–106)
Creatinine, Ser: 0.88 mg/dL (ref 0.76–1.27)
Globulin, Total: 2 g/dL (ref 1.5–4.5)
Glucose: 298 mg/dL — ABNORMAL HIGH (ref 70–99)
Potassium: 4.6 mmol/L (ref 3.5–5.2)
Sodium: 137 mmol/L (ref 134–144)
Total Protein: 6.8 g/dL (ref 6.0–8.5)
eGFR: 110 mL/min/{1.73_m2} (ref >59)

## 2021-12-25 LAB — CBC WITH DIFFERENTIAL/PLATELET
Basophils Absolute: 0 10*3/uL (ref 0.0–0.2)
Basos: 1 %
EOS (ABSOLUTE): 0.1 10*3/uL (ref 0.0–0.4)
Eos: 2 %
Hematocrit: 46.2 % (ref 37.5–51.0)
Hemoglobin: 15.3 g/dL (ref 13.0–17.7)
Immature Grans (Abs): 0 10*3/uL (ref 0.0–0.1)
Immature Granulocytes: 0 %
Lymphocytes Absolute: 1.9 10*3/uL (ref 0.7–3.1)
Lymphs: 28 %
MCH: 29.5 pg (ref 26.6–33.0)
MCHC: 33.1 g/dL (ref 31.5–35.7)
MCV: 89 fL (ref 79–97)
Monocytes Absolute: 0.4 10*3/uL (ref 0.1–0.9)
Monocytes: 6 %
Neutrophils Absolute: 4.3 10*3/uL (ref 1.4–7.0)
Neutrophils: 63 %
Platelets: 223 10*3/uL (ref 150–450)
RBC: 5.19 x10E6/uL (ref 4.14–5.80)
RDW: 13 % (ref 11.6–15.4)
WBC: 6.8 10*3/uL (ref 3.4–10.8)

## 2021-12-25 LAB — LIPID PANEL
Chol/HDL Ratio: 4.5 ratio (ref 0.0–5.0)
Cholesterol, Total: 154 mg/dL (ref 100–199)
HDL: 34 mg/dL — ABNORMAL LOW (ref >39)
LDL Chol Calc (NIH): 55 mg/dL (ref 0–99)
Triglycerides: 431 mg/dL — ABNORMAL HIGH (ref 0–149)
VLDL Cholesterol Cal: 65 mg/dL — ABNORMAL HIGH (ref 5–40)

## 2021-12-25 LAB — BAYER DCA HB A1C WAIVED: HB A1C (BAYER DCA - WAIVED): 8.8 % — ABNORMAL HIGH (ref 4.8–5.6)

## 2021-12-25 MED ORDER — COLCHICINE 0.6 MG PO TABS: 0.6000 mg | ORAL_TABLET | Freq: Every day | ORAL | 3 refills | Status: DC

## 2021-12-25 NOTE — Progress Notes (Signed)
BP 133/85    Pulse (!) 115    Ht _0  (1.854 m)    Wt (!) 358 lb (162.4 kg)    SpO2 98%    BMI 47.23 kg/m    Subjective:   Patient ID: Kevin Pugh, male    DOB: 1981/04/12, 41 y.o.   MRN: [id number]  HPI: Kevin Pugh is a 41 y.o. male presenting on 12/25/2021 for Medication Management ( 6 mo f/u. ), Diabetes, and Hypertension   HPI Type 2 diabetes mellitus Patient comes in today for recheck of his diabetes. Patient has been currently taking Janumet, he says he did that over the holidays.  His A1c is up to 8.8.Marland Kitchen Patient is currently on an ACE inhibitor/ARB. Patient has not seen an ophthalmologist this year. Patient denies any issues with their feet. The symptom started onset as an adult hypertension and hyperlipidemia and hypertriglyceridemia ARE RELATED TO DM   Hypertension Patient is currently on lisinopril and amlodipine, and their blood pressure today is 133/85. Patient denies any lightheadedness or dizziness. Patient denies headaches, blurred vision, chest pains, shortness of breath, or weakness. Denies any side effects from medication and is content with current medication.   Hyperlipidemia and hypertriglyceridemia Patient is coming in for recheck of his hyperlipidemia. The patient is currently taking Crestor and fish oil. They deny any issues with myalgias or history of liver damage from it. They deny any focal numbness or weakness or chest pain.   Gout Last attack: December, 2 months ago.  Takes colchicine on a regular basis and it does help Attacks this year: Multiple in December when he ran out of colchicine Medication: Colchicine 0.6 Location of attacks: Bilateral feet and ankles  Relevant past medical, surgical, family and social history reviewed and updated as indicated. Interim medical history since our last visit reviewed. Allergies and medications reviewed and updated.  Review of Systems  Constitutional:  Negative for chills and fever.  Eyes:  Negative for visual  disturbance.  Respiratory:  Negative for shortness of breath and wheezing.   Cardiovascular:  Negative for chest pain and leg swelling.  Musculoskeletal:  Negative for back pain and gait problem.  Skin:  Negative for rash.  Neurological:  Negative for dizziness, weakness and light-headedness.  All other systems reviewed and are negative.  Per HPI unless specifically indicated above   Allergies as of 12/25/2021       Reactions   Prednisone Anaphylaxis, Hives, Swelling   Keflex [cephalexin Monohydrate]    Like thrush on the outside of mouth        Medication List        Accurate as of December 25, 2021 11:37 AM. If you have any questions, ask your nurse or doctor.          STOP taking these medications    fenofibrate 145 MG tablet Commonly known as: Tricor Stopped by: Worthy Rancher, MD       TAKE these medications    albuterol 108 (90 Base) MCG/ACT inhaler Commonly known as: VENTOLIN HFA Inhale 2 puffs into the lungs every 6 (six) hours as needed. For shortness of breath   amLODipine 10 MG tablet Commonly known as: NORVASC Take 1 tablet (10 mg total) by mouth daily.   cetirizine 10 MG tablet Commonly known as: ZYRTEC Take 10 mg by mouth at bedtime.   cholecalciferol 1000 units tablet Commonly known as: VITAMIN D Take 1,000 Units by mouth daily.   colchicine 0.6 MG tablet Take  1 tablet (0.6 mg total) by mouth daily.   diclofenac 75 MG EC tablet Commonly known as: VOLTAREN Take 1 tablet (75 mg total) by mouth 2 (two) times daily. FOR LOWER BACK PAIN   fish oil-omega-3 fatty acids 1000 MG capsule Take 1 g by mouth daily.   Janumet 50-1000 MG tablet Generic drug: sitaGLIPtin-metformin Take 1 tablet by mouth 2 (two) times daily with a meal.   lisinopril 20 MG tablet Commonly known as: ZESTRIL TAKE 1 TABLET (20 MG TOTAL) BY MOUTH DAILY. (NEEDS TO BE SEEN BEFORE NEXT REFILL)   rosuvastatin 20 MG tablet Commonly known as: CRESTOR TAKE 1 TABLET  BY MOUTH EVERY DAY         Objective:   BP 133/85    Pulse (!) 115    Ht _0  (1.854 m)    Wt (!) 358 lb (162.4 kg)    SpO2 98%    BMI 47.23 kg/m   Wt Readings from Last 3 Encounters:  12/25/21 (!) 358 lb (162.4 kg)  05/27/21 (!) 370 lb (167.8 kg)  04/19/20 (!) 386 lb 4 oz (175.2 kg)    Physical Exam Vitals and nursing note reviewed.  Constitutional:      General: He is not in acute distress.    Appearance: He is well-developed. He is not diaphoretic.  Eyes:     General: No scleral icterus.    Conjunctiva/sclera: Conjunctivae normal.  Neck:     Thyroid: No thyromegaly.  Cardiovascular:     Rate and Rhythm: Normal rate and regular rhythm.     Heart sounds: Normal heart sounds. No murmur heard. Pulmonary:     Effort: Pulmonary effort is normal. No respiratory distress.     Breath sounds: Normal breath sounds. No wheezing.  Musculoskeletal:        General: No swelling. Normal range of motion.     Cervical back: Neck supple.  Lymphadenopathy:     Cervical: No cervical adenopathy.  Skin:    General: Skin is warm and dry.     Findings: No rash.  Neurological:     Mental Status: He is alert and oriented to person, place, and time.     Coordination: Coordination normal.  Psychiatric:        Behavior: Behavior normal.      Assessment & Plan:   Problem List Items Addressed This Visit       Cardiovascular and Mediastinum   Essential hypertension   Relevant Orders   CMP14+EGFR   CBC with Differential/Platelet   Lipid panel     Endocrine   Type 2 diabetes mellitus with other specified complication ([place]) - Primary   Relevant Orders   CMP14+EGFR   CBC with Differential/Platelet   Lipid panel   Bayer DCA Hb A1c Waived   Hyperlipidemia associated with type 2 diabetes mellitus ([place])     Other   Gout  Diabetes is elevated at 8.8.  Patient says it is just because he had a bad holidays and he is trying to refocus.  Wants to try diet exercise at this point.  Will  not add any new medicine but if still elevated on the next exam then will have to tackle it with more medicine.  Follow up plan: Return in about 3 months (around 03/24/2022), or if symptoms worsen or fail to improve, for Diabetes hypertension and hld.  Counseling provided for all of the vaccine components Orders Placed This Encounter  Procedures   CMP14+EGFR   CBC with  Differential/Platelet   Lipid panel   Bayer DCA Hb A1c [place], MD [facility] 12/25/2021, 11:37 AM

## 2021-12-31 ENCOUNTER — Other Ambulatory Visit: Payer: Self-pay | Admitting: Family Medicine

## 2021-12-31 DIAGNOSIS — E781 Pure hyperglyceridemia: Secondary | ICD-10-CM

## 2021-12-31 MED ORDER — FENOFIBRATE 48 MG PO TABS: 48.0000 mg | ORAL_TABLET | Freq: Every day | ORAL | 3 refills | Status: DC

## 2022-01-01 ENCOUNTER — Encounter: Payer: Self-pay | Admitting: Family Medicine

## 2022-01-22 DIAGNOSIS — H5213 Myopia, bilateral: Secondary | ICD-10-CM | POA: Diagnosis not present

## 2022-02-27 DIAGNOSIS — G4733 Obstructive sleep apnea (adult) (pediatric): Secondary | ICD-10-CM | POA: Diagnosis not present

## 2022-03-25 ENCOUNTER — Encounter: Payer: Self-pay | Admitting: Family Medicine

## 2022-03-25 ENCOUNTER — Ambulatory Visit (INDEPENDENT_AMBULATORY_CARE_PROVIDER_SITE_OTHER): Payer: Medicaid Other | Admitting: Family Medicine

## 2022-03-25 VITALS — BP 130/85 | HR 100 | Ht 73.0 in | Wt 355.0 lb

## 2022-03-25 DIAGNOSIS — M1A9XX0 Chronic gout, unspecified, without tophus (tophi): Secondary | ICD-10-CM | POA: Diagnosis not present

## 2022-03-25 DIAGNOSIS — I10 Essential (primary) hypertension: Secondary | ICD-10-CM | POA: Diagnosis not present

## 2022-03-25 DIAGNOSIS — E785 Hyperlipidemia, unspecified: Secondary | ICD-10-CM

## 2022-03-25 DIAGNOSIS — E1169 Type 2 diabetes mellitus with other specified complication: Secondary | ICD-10-CM

## 2022-03-25 LAB — BAYER DCA HB A1C WAIVED: HB A1C (BAYER DCA - WAIVED): 9 % — ABNORMAL HIGH (ref 4.8–5.6)

## 2022-03-25 MED ORDER — LISINOPRIL 20 MG PO TABS: 20.0000 mg | ORAL_TABLET | Freq: Every day | ORAL | 3 refills | Status: DC

## 2022-03-25 MED ORDER — ROSUVASTATIN CALCIUM 20 MG PO TABS: 20.0000 mg | ORAL_TABLET | Freq: Every day | ORAL | 3 refills | Status: DC

## 2022-03-25 MED ORDER — AMLODIPINE BESYLATE 10 MG PO TABS: 10.0000 mg | ORAL_TABLET | Freq: Every day | ORAL | 3 refills | Status: DC

## 2022-03-25 MED ORDER — EMPAGLIFLOZIN 10 MG PO TABS: 10.0000 mg | ORAL_TABLET | Freq: Every day | ORAL | 3 refills | Status: DC

## 2022-03-25 MED ORDER — JANUMET 50-1000 MG PO TABS: 1.0000 | ORAL_TABLET | Freq: Two times a day (BID) | ORAL | 3 refills | Status: DC

## 2022-03-25 NOTE — Progress Notes (Signed)
? ?BP 130/85   Pulse 100   Ht _0  (1.854 m)   Wt (!) 355 lb (161 kg)   SpO2 98%   BMI 46.84 kg/m?   ? ?Subjective:  ? ?Patient ID: Kevin Pugh, male    DOB: [birth year], 41 y.o.   MRN: [id number] ? ?HPI: ?Kevin Pugh is a 41 y.o. male presenting on 03/25/2022 for Medical Management of Chronic Issues, Hypertension, Hyperlipidemia, and Diabetes ? ? ?HPI ?Type 2 diabetes mellitus ?Patient comes in today for recheck of his diabetes. Patient has been currently taking Janumet, he says sugars do run up to the 200s but often down to the 100s as well.. Patient is currently on an ACE inhibitor/ARB. Patient has not seen an ophthalmologist this year. Patient denies any issues with their feet. The symptom started onset as an adult hypertension and hyperlipidemia ARE RELATED TO DM  ? ?Hyperlipidemia ?Patient is coming in for recheck of his hyperlipidemia. The patient is currently taking fish oil and fenofibrate and Crestor. They deny any issues with myalgias or history of liver damage from it. They deny any focal numbness or weakness or chest pain.  ? ?Hypertension ?Patient is currently on amlodipine and lisinopril, and their blood pressure today is 130/85. Patient denies any lightheadedness or dizziness. Patient denies headaches, blurred vision, chest pains, shortness of breath, or weakness. Denies any side effects from medication and is content with current medication.  ? ?Gout ?Last attack: Has had some small attacks this year but nothing that he can recall being major ?Attacks this year: No major attacks this year ?Medication: Colchicine ?Location of attacks: Feet ? ?Relevant past medical, surgical, family and social history reviewed and updated as indicated. Interim medical history since our last visit reviewed. ?Allergies and medications reviewed and updated. ? ?Review of Systems  ?Constitutional:  Negative for chills and fever.  ?Eyes:  Negative for visual disturbance.  ?Respiratory:  Negative for shortness of  breath and wheezing.   ?Cardiovascular:  Negative for chest pain and leg swelling.  ?Musculoskeletal:  Negative for back pain and gait problem.  ?Skin:  Negative for rash.  ?Neurological:  Negative for dizziness, weakness and light-headedness.  ?All other systems reviewed and are negative. ? ?Per HPI unless specifically indicated above ? ? ?Allergies as of 03/25/2022   ? ?   Reactions  ? Prednisone Anaphylaxis, Hives, Swelling  ? Keflex [cephalexin Monohydrate]   ? Like thrush on the outside of mouth  ? ?  ? ?  ?Medication List  ?  ? ?  ? Accurate as of Mar 25, 2022 10:42 AM. If you have any questions, ask your nurse or doctor.  ?  ?  ? ?  ? ?STOP taking these medications   ? ?cholecalciferol 1000 units tablet ?Commonly known as: VITAMIN D ?Stopped by: Worthy Rancher, MD ?  ? ?  ? ?TAKE these medications   ? ?albuterol 108 (90 Base) MCG/ACT inhaler ?Commonly known as: VENTOLIN HFA ?Inhale 2 puffs into the lungs every 6 (six) hours as needed. For shortness of breath ?  ?amLODipine 10 MG tablet ?Commonly known as: NORVASC ?Take 1 tablet (10 mg total) by mouth daily. ?  ?cetirizine 10 MG tablet ?Commonly known as: ZYRTEC ?Take 10 mg by mouth at bedtime. ?  ?colchicine 0.6 MG tablet ?Take 1 tablet (0.6 mg total) by mouth daily. ?  ?diclofenac 75 MG EC tablet ?Commonly known as: VOLTAREN ?Take 1 tablet (75 mg total) by mouth 2 (two) times daily.  FOR LOWER BACK PAIN ?  ?empagliflozin 10 MG Tabs tablet ?Commonly known as: Jardiance ?Take 1 tablet (10 mg total) by mouth daily before breakfast. ?Started by: Worthy Rancher, MD ?  ?fenofibrate 48 MG tablet ?Commonly known as: Tricor ?Take 1 tablet (48 mg total) by mouth daily. ?  ?fish oil-omega-3 fatty acids 1000 MG capsule ?Take 1 g by mouth daily. ?  ?Janumet 50-1000 MG tablet ?Generic drug: sitaGLIPtin-metformin ?Take 1 tablet by mouth 2 (two) times daily with a meal. ?  ?lisinopril 20 MG tablet ?Commonly known as: ZESTRIL ?Take 1 tablet (20 mg total) by mouth  daily. ?What changed: additional instructions ?Changed by: Worthy Rancher, MD ?  ?rosuvastatin 20 MG tablet ?Commonly known as: CRESTOR ?Take 1 tablet (20 mg total) by mouth daily. ?  ? ?  ? ? ? ?Objective:  ? ?BP 130/85   Pulse 100   Ht $R'6\' 1"'PY$  (1.854 m)   Wt (!) 355 lb (161 kg)   SpO2 98%   BMI 46.84 kg/m?   ?Wt Readings from Last 3 Encounters:  ?03/25/22 (!) 355 lb (161 kg)  ?12/25/21 (!) 358 lb (162.4 kg)  ?05/27/21 (!) 370 lb (167.8 kg)  ?  ?Physical Exam ?Vitals and nursing note reviewed.  ?Constitutional:   ?   General: He is not in acute distress. ?   Appearance: He is well-developed. He is not diaphoretic.  ?Eyes:  ?   General: No scleral icterus. ?   Conjunctiva/sclera: Conjunctivae normal.  ?Neck:  ?   Thyroid: No thyromegaly.  ?Cardiovascular:  ?   Rate and Rhythm: Normal rate and regular rhythm.  ?   Heart sounds: Normal heart sounds. No murmur heard. ?Pulmonary:  ?   Effort: Pulmonary effort is normal. No respiratory distress.  ?   Breath sounds: Normal breath sounds. No wheezing.  ?Musculoskeletal:     ?   General: No swelling. Normal range of motion.  ?   Cervical back: Neck supple.  ?Lymphadenopathy:  ?   Cervical: No cervical adenopathy.  ?Skin: ?   General: Skin is warm and dry.  ?   Findings: No rash.  ?Neurological:  ?   Mental Status: He is alert and oriented to person, place, and time.  ?   Coordination: Coordination normal.  ?Psychiatric:     ?   Behavior: Behavior normal.  ? ? ? ? ?Assessment & Plan:  ? ?Problem List Items Addressed This Visit   ? ?  ? Cardiovascular and Mediastinum  ? Essential hypertension  ? Relevant Medications  ? amLODipine (NORVASC) 10 MG tablet  ? lisinopril (ZESTRIL) 20 MG tablet  ? rosuvastatin (CRESTOR) 20 MG tablet  ? Other Relevant Orders  ? CBC with Differential/Platelet  ? CMP14+EGFR  ? Lipid panel  ? Bayer DCA Hb A1c Waived  ?  ? Endocrine  ? Type 2 diabetes mellitus with other specified complication ([place]) - Primary  ? Relevant Medications  ? lisinopril  (ZESTRIL) 20 MG tablet  ? rosuvastatin (CRESTOR) 20 MG tablet  ? sitaGLIPtin-metformin (JANUMET) 50-1000 MG tablet  ? empagliflozin (JARDIANCE) 10 MG TABS tablet  ? Other Relevant Orders  ? CBC with Differential/Platelet  ? CMP14+EGFR  ? Lipid panel  ? Bayer DCA Hb A1c Waived  ? Hyperlipidemia associated with type 2 diabetes mellitus ([place])  ? Relevant Medications  ? amLODipine (NORVASC) 10 MG tablet  ? lisinopril (ZESTRIL) 20 MG tablet  ? rosuvastatin (CRESTOR) 20 MG tablet  ? sitaGLIPtin-metformin (JANUMET) 50-1000 MG  tablet  ? empagliflozin (JARDIANCE) 10 MG TABS tablet  ? Other Relevant Orders  ? CBC with Differential/Platelet  ? CMP14+EGFR  ? Lipid panel  ? Bayer DCA Hb A1c Waived  ?  ? Other  ? Gout  ? Relevant Orders  ? Uric acid  ?A1c 9.0, he is losing weight and changing things but still up, will add Jardiance. ? ?Follow up plan: ?Return in about 3 months (around 06/25/2022), or if symptoms worsen or fail to improve, for Diabetes hypertension and cholesterol recheck. ? ?Counseling provided for all of the vaccine components ?Orders Placed This Encounter  ?Procedures  ? CBC with Differential/Platelet  ? CMP14+EGFR  ? Lipid panel  ? Bayer DCA Hb A1c Waived  ? Uric acid  ? ? ?Caryl Pina, MD ?[place] ?03/25/2022, 10:42 AM ? ? ?  ?

## 2022-03-26 LAB — CMP14+EGFR
ALT: 53 IU/L — ABNORMAL HIGH (ref 0–44)
AST: 55 IU/L — ABNORMAL HIGH (ref 0–40)
Albumin/Globulin Ratio: 2 (ref 1.2–2.2)
Albumin: 4.6 g/dL (ref 4.0–5.0)
Alkaline Phosphatase: 91 IU/L (ref 44–121)
BUN/Creatinine Ratio: 10 (ref 9–20)
BUN: 11 mg/dL (ref 6–24)
Bilirubin Total: 0.6 mg/dL (ref 0.0–1.2)
CO2: 21 mmol/L (ref 20–29)
Calcium: 10 mg/dL (ref 8.7–10.2)
Chloride: 100 mmol/L (ref 96–106)
Creatinine, Ser: 1.05 mg/dL (ref 0.76–1.27)
Globulin, Total: 2.3 g/dL (ref 1.5–4.5)
Glucose: 245 mg/dL — ABNORMAL HIGH (ref 70–99)
Potassium: 4.6 mmol/L (ref 3.5–5.2)
Sodium: 135 mmol/L (ref 134–144)
Total Protein: 6.9 g/dL (ref 6.0–8.5)
eGFR: 91 mL/min/{1.73_m2} (ref >59)

## 2022-03-26 LAB — CBC WITH DIFFERENTIAL/PLATELET
Basophils Absolute: 0.1 10*3/uL (ref 0.0–0.2)
Basos: 1 %
EOS (ABSOLUTE): 0.1 10*3/uL (ref 0.0–0.4)
Eos: 2 %
Hematocrit: 43.9 % (ref 37.5–51.0)
Hemoglobin: 14.8 g/dL (ref 13.0–17.7)
Immature Grans (Abs): 0 10*3/uL (ref 0.0–0.1)
Immature Granulocytes: 0 %
Lymphocytes Absolute: 2 10*3/uL (ref 0.7–3.1)
Lymphs: 31 %
MCH: 30.3 pg (ref 26.6–33.0)
MCHC: 33.7 g/dL (ref 31.5–35.7)
MCV: 90 fL (ref 79–97)
Monocytes Absolute: 0.4 10*3/uL (ref 0.1–0.9)
Monocytes: 7 %
Neutrophils Absolute: 3.7 10*3/uL (ref 1.4–7.0)
Neutrophils: 59 %
Platelets: 220 10*3/uL (ref 150–450)
RBC: 4.88 x10E6/uL (ref 4.14–5.80)
RDW: 13 % (ref 11.6–15.4)
WBC: 6.3 10*3/uL (ref 3.4–10.8)

## 2022-03-26 LAB — LIPID PANEL
Chol/HDL Ratio: 5 ratio (ref 0.0–5.0)
Cholesterol, Total: 166 mg/dL (ref 100–199)
HDL: 33 mg/dL — ABNORMAL LOW (ref >39)
LDL Chol Calc (NIH): 69 mg/dL (ref 0–99)
Triglycerides: 411 mg/dL — ABNORMAL HIGH (ref 0–149)
VLDL Cholesterol Cal: 64 mg/dL — ABNORMAL HIGH (ref 5–40)

## 2022-03-26 LAB — URIC ACID: Uric Acid: 5.3 mg/dL (ref 3.8–8.4)

## 2022-04-10 ENCOUNTER — Other Ambulatory Visit: Payer: Self-pay | Admitting: Family Medicine

## 2022-04-10 DIAGNOSIS — M109 Gout, unspecified: Secondary | ICD-10-CM

## 2022-04-13 MED ORDER — MITIGARE 0.6 MG PO CAPS: 0.6000 mg | ORAL_CAPSULE | Freq: Every day | ORAL | 3 refills | Status: DC | PRN

## 2022-04-13 NOTE — Telephone Encounter (Signed)
Sent colchicine as brand-name meta Gari, sometimes insurance companies want [place] to do that instead, this should be the same medicine

## 2022-04-13 NOTE — Telephone Encounter (Signed)
[facility]. He is calling to check up on status.

## 2022-06-26 ENCOUNTER — Ambulatory Visit: Payer: Medicaid Other | Admitting: Family Medicine

## 2022-07-24 ENCOUNTER — Ambulatory Visit: Payer: Medicaid Other | Admitting: Family Medicine

## 2022-07-24 ENCOUNTER — Encounter: Payer: Self-pay | Admitting: Family Medicine

## 2022-07-24 VITALS — BP 131/92 | HR 104 | Temp 98.0°F | Ht 73.0 in | Wt 358.0 lb

## 2022-07-24 DIAGNOSIS — E785 Hyperlipidemia, unspecified: Secondary | ICD-10-CM | POA: Diagnosis not present

## 2022-07-24 DIAGNOSIS — I10 Essential (primary) hypertension: Secondary | ICD-10-CM | POA: Diagnosis not present

## 2022-07-24 DIAGNOSIS — E1169 Type 2 diabetes mellitus with other specified complication: Secondary | ICD-10-CM

## 2022-07-24 LAB — BAYER DCA HB A1C WAIVED: HB A1C (BAYER DCA - WAIVED): 8.3 % — ABNORMAL HIGH (ref 4.8–5.6)

## 2022-07-24 MED ORDER — SEMAGLUTIDE(0.25 OR 0.5MG/DOS) 2 MG/3ML ~~LOC~~ SOPN: 0.2500 mg | PEN_INJECTOR | SUBCUTANEOUS | 3 refills | Status: DC

## 2022-07-24 MED ORDER — METFORMIN HCL 1000 MG PO TABS: 1000.0000 mg | ORAL_TABLET | Freq: Two times a day (BID) | ORAL | 3 refills | Status: DC

## 2022-07-24 NOTE — Progress Notes (Signed)
BP (!) 131/92   Pulse (!) 104   Temp 98 F (36.7 C)   Ht 6\' 1"  (1.854 m)   Wt (!) 358 lb (162.4 kg)   SpO2 99%   BMI 47.23 kg/m    Subjective:   Patient ID: Kevin Pugh, male    DOB: 30-May-1981, 41 y.o.   MRN: [id number]  HPI: Kevin Pugh is a 41 y.o. male presenting on 07/24/2022 for Medical Management of Chronic Issues, Hypertension, and Diabetes   HPI Type 2 diabetes mellitus Patient comes in today for recheck of his diabetes. Patient has been currently taking Janumet, stopped the Jardiance because it was getting lightheaded and dizzy. Patient is currently on an ACE inhibitor/ARB. Patient has not seen an ophthalmologist this year. Patient denies any issues with their feet. The symptom started onset as an adult hypertension and hyperlipidemia ARE RELATED TO DM.  Since stopping Jardiance he does not have the lightheadedness dizziness anymore.  Hyperlipidemia Patient is coming in for recheck of his hyperlipidemia. The patient is currently taking Crestor and fish oil and fenofibrate. They deny any issues with myalgias or history of liver damage from it. They deny any focal numbness or weakness or chest pain.   Hypertension Patient is currently on amlodipine and lisinopril, and their blood pressure today is 131/92. Patient denies any lightheadedness or dizziness. Patient denies headaches, blurred vision, chest pains, shortness of breath, or weakness. Denies any side effects from medication and is content with current medication.   Relevant past medical, surgical, family and social history reviewed and updated as indicated. Interim medical history since our last visit reviewed. Allergies and medications reviewed and updated.  Review of Systems  Constitutional:  Negative for chills and fever.  Eyes:  Negative for visual disturbance.  Respiratory:  Negative for shortness of breath and wheezing.   Cardiovascular:  Negative for chest pain and leg swelling.  Musculoskeletal:   Negative for back pain and gait problem.  Skin:  Negative for rash.  Neurological:  Negative for dizziness, weakness and light-headedness.  All other systems reviewed and are negative.   Per HPI unless specifically indicated above   Allergies as of 07/24/2022       Reactions   Prednisone Anaphylaxis, Hives, Swelling   Keflex [cephalexin Monohydrate]    Like thrush on the outside of mouth        Medication List        Accurate as of July 24, 2022 10:48 AM. If you have any questions, ask your nurse or doctor.          STOP taking these medications    empagliflozin 10 MG Tabs tablet Commonly known as: Jardiance Stopped by: July 26, 2022, MD   Janumet 50-1000 MG tablet Generic drug: sitaGLIPtin-metformin Stopped by: Nils Pyle Khayri Kargbo, MD       TAKE these medications    albuterol 108 (90 Base) MCG/ACT inhaler Commonly known as: VENTOLIN HFA Inhale 2 puffs into the lungs every 6 (six) hours as needed. For shortness of breath   amLODipine 10 MG tablet Commonly known as: NORVASC Take 1 tablet (10 mg total) by mouth daily.   cetirizine 10 MG tablet Commonly known as: ZYRTEC Take 10 mg by mouth at bedtime.   colchicine 0.6 MG tablet Take 1 tablet (0.6 mg total) by mouth daily.   Mitigare 0.6 MG Caps Generic drug: Colchicine Take 0.6 mg by mouth daily as needed.   diclofenac 75 MG EC tablet Commonly known as: VOLTAREN  Take 1 tablet (75 mg total) by mouth 2 (two) times daily. FOR LOWER BACK PAIN   fenofibrate 48 MG tablet Commonly known as: Tricor Take 1 tablet (48 mg total) by mouth daily.   fish oil-omega-3 fatty acids 1000 MG capsule Take 1 g by mouth daily.   lisinopril 20 MG tablet Commonly known as: ZESTRIL Take 1 tablet (20 mg total) by mouth daily.   metFORMIN 1000 MG tablet Commonly known as: GLUCOPHAGE Take 1 tablet (1,000 mg total) by mouth 2 (two) times daily with a meal. Started by: Elige Radon Elvena Oyer, MD   rosuvastatin 20  MG tablet Commonly known as: CRESTOR Take 1 tablet (20 mg total) by mouth daily.   Semaglutide(0.25 or 0.5MG /DOS) 2 MG/3ML Sopn Inject 0.25 mg into the skin once a week. Started by: Elige Radon Judia Arnott, MD         Objective:   BP (!) 131/92   Pulse (!) 104   Temp 98 F (36.7 C)   Ht 6\' 1"  (1.854 m)   Wt (!) 358 lb (162.4 kg)   SpO2 99%   BMI 47.23 kg/m   Wt Readings from Last 3 Encounters:  07/24/22 (!) 358 lb (162.4 kg)  03/25/22 (!) 355 lb (161 kg)  12/25/21 (!) 358 lb (162.4 kg)    Physical Exam Vitals and nursing note reviewed.  Constitutional:      General: He is not in acute distress.    Appearance: He is well-developed. He is not diaphoretic.  Eyes:     General: No scleral icterus.    Conjunctiva/sclera: Conjunctivae normal.  Neck:     Thyroid: No thyromegaly.  Cardiovascular:     Rate and Rhythm: Normal rate and regular rhythm.     Heart sounds: Normal heart sounds. No murmur heard. Pulmonary:     Effort: Pulmonary effort is normal. No respiratory distress.     Breath sounds: Normal breath sounds. No wheezing.  Musculoskeletal:        General: No swelling. Normal range of motion.     Cervical back: Neck supple.  Lymphadenopathy:     Cervical: No cervical adenopathy.  Skin:    General: Skin is warm and dry.     Findings: No rash.  Neurological:     Mental Status: He is alert and oriented to person, place, and time.     Coordination: Coordination normal.  Psychiatric:        Behavior: Behavior normal.       Assessment & Plan:   Problem List Items Addressed This Visit       Cardiovascular and Mediastinum   Essential hypertension     Endocrine   Type 2 diabetes mellitus with other specified complication ([facility]) - Primary   Relevant Medications   Semaglutide,0.25 or 0.5MG /DOS, 2 MG/3ML SOPN   metFORMIN (GLUCOPHAGE) 1000 MG tablet   Other Relevant Orders   Bayer DCA Hb A1c Waived   Microalbumin / creatinine urine ratio   Hyperlipidemia  associated with type 2 diabetes mellitus ([facility])   Relevant Medications   Semaglutide,0.25 or 0.5MG /DOS, 2 MG/3ML SOPN   metFORMIN (GLUCOPHAGE) 1000 MG tablet    We will switch from Janumet to Ozempic and metformin.  We will see if that does better by his A1c.  He did not tolerate Jardiance because of lightheadedness and dizziness, likely his blood pressure went down. Follow up plan: Return in about 3 months (around 10/23/2022), or if symptoms worsen or fail to improve, for Diabetes.  Counseling provided  for all of the vaccine components Orders Placed This Encounter  Procedures   Bayer DCA Hb A1c Waived   Microalbumin / creatinine urine ratio    Arville Care, MD [facility] 07/24/2022, 10:48 AM

## 2022-10-22 ENCOUNTER — Ambulatory Visit: Payer: Medicaid Other | Admitting: Family Medicine

## 2022-11-14 ENCOUNTER — Other Ambulatory Visit: Payer: Self-pay | Admitting: Family Medicine

## 2022-11-14 DIAGNOSIS — M109 Gout, unspecified: Secondary | ICD-10-CM

## 2022-11-23 ENCOUNTER — Encounter: Payer: Self-pay | Admitting: Family Medicine

## 2022-11-23 ENCOUNTER — Ambulatory Visit: Payer: Medicaid Other | Admitting: Family Medicine

## 2022-11-23 VITALS — BP 123/82 | HR 104 | Temp 98.4°F | Ht 73.0 in | Wt 353.0 lb

## 2022-11-23 DIAGNOSIS — E1169 Type 2 diabetes mellitus with other specified complication: Secondary | ICD-10-CM | POA: Diagnosis not present

## 2022-11-23 DIAGNOSIS — I10 Essential (primary) hypertension: Secondary | ICD-10-CM | POA: Diagnosis not present

## 2022-11-23 DIAGNOSIS — M778 Other enthesopathies, not elsewhere classified: Secondary | ICD-10-CM

## 2022-11-23 DIAGNOSIS — E781 Pure hyperglyceridemia: Secondary | ICD-10-CM

## 2022-11-23 DIAGNOSIS — E785 Hyperlipidemia, unspecified: Secondary | ICD-10-CM

## 2022-11-23 LAB — BAYER DCA HB A1C WAIVED: HB A1C (BAYER DCA - WAIVED): 7.6 % — ABNORMAL HIGH (ref 4.8–5.6)

## 2022-11-23 MED ORDER — AMLODIPINE BESYLATE 10 MG PO TABS: 10.0000 mg | ORAL_TABLET | Freq: Every day | ORAL | 3 refills | Status: DC

## 2022-11-23 MED ORDER — FENOFIBRATE 48 MG PO TABS: 48.0000 mg | ORAL_TABLET | Freq: Every day | ORAL | 3 refills | Status: DC

## 2022-11-23 MED ORDER — LISINOPRIL 20 MG PO TABS: 20.0000 mg | ORAL_TABLET | Freq: Every day | ORAL | 3 refills | Status: DC

## 2022-11-23 MED ORDER — LIDOCAINE 5 % EX PTCH: 1.0000 | MEDICATED_PATCH | CUTANEOUS | 5 refills | Status: DC

## 2022-11-23 NOTE — Progress Notes (Signed)
BP 123/82   Pulse (!) 104   Temp 98.4 F (36.9 C)   Ht 6\' 1"  (1.854 m)   Wt (!) 353 lb (160.1 kg)   SpO2 94%   BMI 46.57 kg/m    Subjective:   Patient ID: Kevin Pugh, male    DOB: Nov 09, 1981, 42 y.o.   MRN: [id number]  HPI: Kevin Pugh is a 42 y.o. male presenting on 11/23/2022 for Medical Management of Chronic Issues   HPI Type 2 diabetes mellitus Patient comes in today for recheck of his diabetes. Patient has been currently taking metformin and Ozempic. Patient is currently on an ACE inhibitor/ARB. Patient has not seen an ophthalmologist this year. Patient denies any issues with their feet. The symptom started onset as an adult  Hypertension and hyperlipidemia ARE RELATED TO DM   Hypertension Patient is currently on amlodipine and lisinopril, and their blood pressure today is 123/82. Patient denies any lightheadedness or dizziness. Patient denies headaches, blurred vision, chest pains, shortness of breath, or weakness. Denies any side effects from medication and is content with current medication.   Hyperlipidemia Patient is coming in for recheck of his hyperlipidemia. The patient is currently taking fenofibrate and fish oil and Crestor. They deny any issues with myalgias or history of liver damage from it. They deny any focal numbness or weakness or chest pain.   Relevant past medical, surgical, family and social history reviewed and updated as indicated. Interim medical history since our last visit reviewed. Allergies and medications reviewed and updated.  Review of Systems  Constitutional:  Negative for chills and fever.  Eyes:  Negative for visual disturbance.  Respiratory:  Negative for shortness of breath and wheezing.   Cardiovascular:  Negative for chest pain and leg swelling.  Musculoskeletal:  Negative for back pain and gait problem.  Skin:  Negative for rash.  Neurological:  Negative for dizziness, weakness and light-headedness.  All other systems reviewed  and are negative.   Per HPI unless specifically indicated above   Allergies as of 11/23/2022       Reactions   Prednisone Anaphylaxis, Hives, Swelling   Keflex [cephalexin Monohydrate]    Like thrush on the outside of mouth        Medication List        Accurate as of November 23, 2022  3:07 PM. If you have any questions, ask your nurse or doctor.          albuterol 108 (90 Base) MCG/ACT inhaler Commonly known as: VENTOLIN HFA Inhale 2 puffs into the lungs every 6 (six) hours as needed. For shortness of breath   amLODipine 10 MG tablet Commonly known as: NORVASC Take 1 tablet (10 mg total) by mouth daily.   cetirizine 10 MG tablet Commonly known as: ZYRTEC Take 10 mg by mouth at bedtime.   diclofenac 75 MG EC tablet Commonly known as: VOLTAREN Take 1 tablet (75 mg total) by mouth 2 (two) times daily. FOR LOWER BACK PAIN   fenofibrate 48 MG tablet Commonly known as: Tricor Take 1 tablet (48 mg total) by mouth daily.   fish oil-omega-3 fatty acids 1000 MG capsule Take 1 g by mouth daily.   lidocaine 5 % Commonly known as: Lidoderm Place 1 patch onto the skin daily. Remove & Discard patch within 12 hours or as directed by MD Started by: November 25, 2022 Naama Sappington, MD   lisinopril 20 MG tablet Commonly known as: ZESTRIL Take 1 tablet (20 mg total) by mouth  daily.   metFORMIN 1000 MG tablet Commonly known as: GLUCOPHAGE Take 1 tablet (1,000 mg total) by mouth 2 (two) times daily with a meal.   Mitigare 0.6 MG Caps Generic drug: Colchicine Take 0.6 mg by mouth daily as needed.   colchicine 0.6 MG tablet TAKE 1 TABLET BY MOUTH EVERY DAY   rosuvastatin 20 MG tablet Commonly known as: CRESTOR Take 1 tablet (20 mg total) by mouth daily.   Semaglutide(0.25 or 0.5MG /DOS) 2 MG/3ML Sopn Inject 0.25 mg into the skin once a week.         Objective:   BP 123/82   Pulse (!) 104   Temp 98.4 F (36.9 C)   Ht 6\' 1"  (1.854 m)   Wt (!) 353 lb (160.1 kg)   SpO2  94%   BMI 46.57 kg/m   Wt Readings from Last 3 Encounters:  11/23/22 (!) 353 lb (160.1 kg)  07/24/22 (!) 358 lb (162.4 kg)  03/25/22 (!) 355 lb (161 kg)    Physical Exam Vitals and nursing note reviewed.  Constitutional:      General: He is not in acute distress.    Appearance: He is well-developed. He is not diaphoretic.  Eyes:     General: No scleral icterus.    Conjunctiva/sclera: Conjunctivae normal.  Neck:     Thyroid: No thyromegaly.  Cardiovascular:     Rate and Rhythm: Normal rate and regular rhythm.     Heart sounds: Normal heart sounds. No murmur heard. Pulmonary:     Effort: Pulmonary effort is normal. No respiratory distress.     Breath sounds: Normal breath sounds. No wheezing.  Musculoskeletal:        General: Normal range of motion.     Cervical back: Neck supple.  Lymphadenopathy:     Cervical: No cervical adenopathy.  Skin:    General: Skin is warm and dry.     Findings: No rash.  Neurological:     Mental Status: He is alert and oriented to person, place, and time.     Coordination: Coordination normal.  Psychiatric:        Behavior: Behavior normal.       Assessment & Plan:   Problem List Items Addressed This Visit       Cardiovascular and Mediastinum   Essential hypertension   Relevant Medications   amLODipine (NORVASC) 10 MG tablet   fenofibrate (TRICOR) 48 MG tablet   lisinopril (ZESTRIL) 20 MG tablet   Other Relevant Orders   CBC with Differential/Platelet     Endocrine   Type 2 diabetes mellitus with other specified complication (HCC) - Primary   Relevant Medications   lisinopril (ZESTRIL) 20 MG tablet   Other Relevant Orders   Microalbumin / creatinine urine ratio   CMP14+EGFR   Bayer DCA Hb A1c Waived   Hyperlipidemia associated with type 2 diabetes mellitus (HCC)   Relevant Medications   amLODipine (NORVASC) 10 MG tablet   fenofibrate (TRICOR) 48 MG tablet   lisinopril (ZESTRIL) 20 MG tablet   Other Visit Diagnoses      Hypertriglyceridemia       Relevant Medications   amLODipine (NORVASC) 10 MG tablet   fenofibrate (TRICOR) 48 MG tablet   lisinopril (ZESTRIL) 20 MG tablet   Other Relevant Orders   Lipid panel   Left shoulder tendinitis       Relevant Medications   lidocaine (LIDODERM) 5 %       Shoulder pain still bothers him some and  somebody gave him a lidocaine patch and it really helped and he would like to try to get on his insurance.  A1c is doing okay at 7.6.  It is improving, will keep medication the same and focus on diet.  He is also down on weight as well. Follow up plan: Return in about 3 months (around 02/22/2023), or if symptoms worsen or fail to improve, for Diabetes recheck.  Counseling provided for all of the vaccine components Orders Placed This Encounter  Procedures   Microalbumin / creatinine urine ratio   CBC with Differential/Platelet   Lipid panel   CMP14+EGFR   Bayer DCA Hb A1c Waived    Caryl Pina, MD [facility] 11/23/2022, 3:07 PM

## 2022-11-24 LAB — CMP14+EGFR
ALT: 40 IU/L (ref 0–44)
AST: 38 IU/L (ref 0–40)
Albumin/Globulin Ratio: 2 (ref 1.2–2.2)
Albumin: 4.7 g/dL (ref 4.1–5.1)
Alkaline Phosphatase: 87 IU/L (ref 44–121)
BUN/Creatinine Ratio: 12 (ref 9–20)
BUN: 12 mg/dL (ref 6–24)
Bilirubin Total: 0.5 mg/dL (ref 0.0–1.2)
CO2: 21 mmol/L (ref 20–29)
Calcium: 10 mg/dL (ref 8.7–10.2)
Chloride: 101 mmol/L (ref 96–106)
Creatinine, Ser: 0.97 mg/dL (ref 0.76–1.27)
Globulin, Total: 2.4 g/dL (ref 1.5–4.5)
Glucose: 179 mg/dL — ABNORMAL HIGH (ref 70–99)
Potassium: 4.2 mmol/L (ref 3.5–5.2)
Sodium: 137 mmol/L (ref 134–144)
Total Protein: 7.1 g/dL (ref 6.0–8.5)
eGFR: 99 mL/min/{1.73_m2} (ref >59)

## 2022-11-24 LAB — LIPID PANEL
Chol/HDL Ratio: 3.8 ratio (ref 0.0–5.0)
Cholesterol, Total: 145 mg/dL (ref 100–199)
HDL: 38 mg/dL — ABNORMAL LOW (ref >39)
LDL Chol Calc (NIH): 53 mg/dL (ref 0–99)
Triglycerides: 356 mg/dL — ABNORMAL HIGH (ref 0–149)
VLDL Cholesterol Cal: 54 mg/dL — ABNORMAL HIGH (ref 5–40)

## 2022-11-24 LAB — MICROALBUMIN / CREATININE URINE RATIO
Creatinine, Urine: 187.3 mg/dL
Microalb/Creat Ratio: 3 mg/g creat (ref 0–29)
Microalbumin, Urine: 6.3 ug/mL

## 2022-11-24 LAB — CBC WITH DIFFERENTIAL/PLATELET
Basophils Absolute: 0 10*3/uL (ref 0.0–0.2)
Basos: 0 %
EOS (ABSOLUTE): 0.1 10*3/uL (ref 0.0–0.4)
Eos: 2 %
Hematocrit: 44.8 % (ref 37.5–51.0)
Hemoglobin: 14.7 g/dL (ref 13.0–17.7)
Immature Grans (Abs): 0 10*3/uL (ref 0.0–0.1)
Immature Granulocytes: 0 %
Lymphocytes Absolute: 2.5 10*3/uL (ref 0.7–3.1)
Lymphs: 38 %
MCH: 30 pg (ref 26.6–33.0)
MCHC: 32.8 g/dL (ref 31.5–35.7)
MCV: 91 fL (ref 79–97)
Monocytes Absolute: 0.4 10*3/uL (ref 0.1–0.9)
Monocytes: 7 %
Neutrophils Absolute: 3.6 10*3/uL (ref 1.4–7.0)
Neutrophils: 53 %
Platelets: 218 10*3/uL (ref 150–450)
RBC: 4.9 x10E6/uL (ref 4.14–5.80)
RDW: 13.2 % (ref 11.6–15.4)
WBC: 6.8 10*3/uL (ref 3.4–10.8)

## 2023-02-07 ENCOUNTER — Other Ambulatory Visit: Payer: Self-pay | Admitting: Family Medicine

## 2023-02-07 DIAGNOSIS — E1169 Type 2 diabetes mellitus with other specified complication: Secondary | ICD-10-CM

## 2023-02-07 DIAGNOSIS — M109 Gout, unspecified: Secondary | ICD-10-CM

## 2023-02-10 ENCOUNTER — Telehealth: Payer: Self-pay

## 2023-02-10 NOTE — Telephone Encounter (Signed)
Kevin Pugh (Kevin Pugh) Rx #: [id number] Ozempic (0.25 or 0.5 MG/DOSE) 2MG /3ML pen-injectors Form [employer] Electronic Prior Authorization Form (2017 NCPDP) Created 2 days ago Sent to Plan 38 minutes ago Plan Response 38 minutes ago Submit Clinical Questions 31 minutes ago Determination Favorable 1 minute ago Message from Plan Request Reference Number: [id number]. OZEMPIC INJ 2MG /3ML is approved through 02/10/2024. For further questions, call [employer] at [phone].Marland Kitchen Authorization Expiration Date: February 10, 2024.  Pharmacy and patient aware

## 2023-02-10 NOTE — Telephone Encounter (Signed)
Kevin Pugh (Kevin Pugh) Rx #: [id number] Ozempic (0.25 or 0.5 MG/DOSE) 2MG /3ML pen-injectors Form [employer] Electronic Prior Authorization Form (2017 NCPDP) Created 2 days ago Sent to Plan 8 minutes ago Plan Response 7 minutes ago Submit Clinical Questions less than a minute ago Determination Wait for Determination Please wait for [employer] 2017 NCPDP to return a determination.

## 2023-02-16 ENCOUNTER — Other Ambulatory Visit (HOSPITAL_COMMUNITY): Payer: Self-pay

## 2023-02-16 NOTE — Telephone Encounter (Signed)
Patient Advocate Encounter  Prior Authorization for Ozempic has been approved.    

## 2023-02-22 ENCOUNTER — Ambulatory Visit: Payer: Medicaid Other | Admitting: Family Medicine

## 2023-02-22 ENCOUNTER — Encounter: Payer: Self-pay | Admitting: Family Medicine

## 2023-02-22 VITALS — BP 130/9 | HR 110 | Ht 73.0 in | Wt 351.0 lb

## 2023-02-22 DIAGNOSIS — I10 Essential (primary) hypertension: Secondary | ICD-10-CM | POA: Diagnosis not present

## 2023-02-22 DIAGNOSIS — E785 Hyperlipidemia, unspecified: Secondary | ICD-10-CM | POA: Diagnosis not present

## 2023-02-22 DIAGNOSIS — E1169 Type 2 diabetes mellitus with other specified complication: Secondary | ICD-10-CM

## 2023-02-22 LAB — BAYER DCA HB A1C WAIVED: HB A1C (BAYER DCA - WAIVED): 8.2 % — ABNORMAL HIGH (ref 4.8–5.6)

## 2023-02-22 MED ORDER — OZEMPIC (0.25 OR 0.5 MG/DOSE) 2 MG/3ML ~~LOC~~ SOPN: 0.5000 mg | PEN_INJECTOR | SUBCUTANEOUS | 3 refills | Status: DC

## 2023-02-22 NOTE — Progress Notes (Signed)
BP (!) 130/9   Pulse (!) 110   Ht 6\' 1"  (1.854 m)   Wt (!) 351 lb (159.2 kg)   SpO2 98%   BMI 46.31 kg/m    Subjective:   Patient ID: Kevin Pugh, male    DOB: 07/17/1981, 42 y.o.   MRN: [id number]  HPI: Kevin Pugh is a 42 y.o. male presenting on 02/22/2023 for Medical Management of Chronic Issues, Diabetes, and Hypertension   HPI Type 2 diabetes mellitus Patient comes in today for recheck of his diabetes. Patient has been currently taking Ozempic and metformin. Patient is currently on an ACE inhibitor/ARB. Patient has not seen an ophthalmologist this year. Patient denies any new issues with their feet. The symptom started onset as an adult hypertension and hyperlipidemia ARE RELATED TO DM   Hypertension Patient is currently on lisinopril and amlodipine, and their blood pressure today is 130/80. Patient denies any lightheadedness or dizziness. Patient denies headaches, blurred vision, chest pains, shortness of breath, or weakness. Denies any side effects from medication and is content with current medication.   Hyperlipidemia Patient is coming in for recheck of his hyperlipidemia. The patient is currently taking fenofibrate and Crestor. They deny any issues with myalgias or history of liver damage from it. They deny any focal numbness or weakness or chest pain.   Relevant past medical, surgical, family and social history reviewed and updated as indicated. Interim medical history since our last visit reviewed. Allergies and medications reviewed and updated.  Review of Systems  Constitutional:  Negative for chills and fever.  Eyes:  Negative for visual disturbance.  Respiratory:  Negative for shortness of breath and wheezing.   Cardiovascular:  Negative for chest pain and leg swelling.  Musculoskeletal:  Negative for back pain and gait problem.  Skin:  Negative for rash.  Neurological:  Negative for dizziness and light-headedness.  All other systems reviewed and are  negative.   Per HPI unless specifically indicated above   Allergies as of 02/22/2023       Reactions   Prednisone Anaphylaxis, Hives, Swelling   Keflex [cephalexin Monohydrate]    Like thrush on the outside of mouth        Medication List        Accurate as of February 22, 2023 10:54 AM. If you have any questions, ask your nurse or doctor.          albuterol 108 (90 Base) MCG/ACT inhaler Commonly known as: VENTOLIN HFA Inhale 2 puffs into the lungs every 6 (six) hours as needed. For shortness of breath   amLODipine 10 MG tablet Commonly known as: NORVASC Take 1 tablet (10 mg total) by mouth daily.   cetirizine 10 MG tablet Commonly known as: ZYRTEC Take 10 mg by mouth at bedtime.   diclofenac 75 MG EC tablet Commonly known as: VOLTAREN Take 1 tablet (75 mg total) by mouth 2 (two) times daily. FOR LOWER BACK PAIN   fenofibrate 48 MG tablet Commonly known as: Tricor Take 1 tablet (48 mg total) by mouth daily.   fish oil-omega-3 fatty acids 1000 MG capsule Take 1 g by mouth daily.   lidocaine 5 % Commonly known as: Lidoderm Place 1 patch onto the skin daily. Remove & Discard patch within 12 hours or as directed by MD   lisinopril 20 MG tablet Commonly known as: ZESTRIL Take 1 tablet (20 mg total) by mouth daily.   metFORMIN 1000 MG tablet Commonly known as: GLUCOPHAGE Take 1 tablet (  1,000 mg total) by mouth 2 (two) times daily with a meal.   Mitigare 0.6 MG Caps Generic drug: Colchicine Take 0.6 mg by mouth daily as needed.   colchicine 0.6 MG tablet TAKE 1 TABLET BY MOUTH EVERY DAY   Ozempic (0.25 or 0.5 MG/DOSE) 2 MG/3ML Sopn Generic drug: Semaglutide(0.25 or 0.5MG /DOS) Inject 0.5 mg into the skin once a week. What changed: See the new instructions. Changed by: Elige Radon Lynessa Almanzar, MD   rosuvastatin 20 MG tablet Commonly known as: CRESTOR Take 1 tablet (20 mg total) by mouth daily.         Objective:   BP (!) 130/9   Pulse (!) 110   Ht   (1.854 m)   Wt (!) 351 lb (159.2 kg)   SpO2 98%   BMI 46.31 kg/m   Wt Readings from Last 3 Encounters:  02/22/23 (!) 351 lb (159.2 kg)  11/23/22 (!) 353 lb (160.1 kg)  07/24/22 (!) 358 lb (162.4 kg)    Physical Exam Vitals and nursing note reviewed.  Constitutional:      General: He is not in acute distress.    Appearance: He is well-developed. He is not diaphoretic.  Eyes:     General: No scleral icterus.       Right eye: No discharge.     Conjunctiva/sclera: Conjunctivae normal.     Pupils: Pupils are equal, round, and reactive to light.  Neck:     Thyroid: No thyromegaly.  Cardiovascular:     Rate and Rhythm: Normal rate and regular rhythm.     Heart sounds: Normal heart sounds. No murmur heard. Pulmonary:     Effort: Pulmonary effort is normal. No respiratory distress.     Breath sounds: Normal breath sounds. No wheezing.  Musculoskeletal:        General: Normal range of motion.     Cervical back: Neck supple.  Lymphadenopathy:     Cervical: No cervical adenopathy.  Skin:    General: Skin is warm and dry.     Findings: No rash.  Neurological:     Mental Status: He is alert and oriented to person, place, and time.     Coordination: Coordination normal.  Psychiatric:        Behavior: Behavior normal.       Assessment & Plan:   Problem List Items Addressed This Visit       Cardiovascular and Mediastinum   Essential hypertension     Endocrine   Type 2 diabetes mellitus with other specified complication - Primary   Relevant Medications   Semaglutide,0.25 or 0.5MG /DOS, (OZEMPIC, 0.25 OR 0.5 MG/DOSE,) 2 MG/3ML SOPN   Other Relevant Orders   Bayer DCA Hb A1c Waived   Hyperlipidemia associated with type 2 diabetes mellitus   Relevant Medications   Semaglutide,0.25 or 0.5MG /DOS, (OZEMPIC, 0.25 OR 0.5 MG/DOSE,) 2 MG/3ML SOPN    Increase Ozempic to 0.5 mg weekly  Blood pressure looks good, no other changes. Follow up plan: Return in about 3 months  (around 05/24/2023), or if symptoms worsen or fail to improve, for Type 2 diabetes.  Counseling provided for all of the vaccine components Orders Placed This Encounter  Procedures   Bayer DCA Hb A1c Waived    Arville Care, MD [facility] 02/22/2023, 10:54 AM

## 2023-03-15 ENCOUNTER — Telehealth: Payer: Self-pay

## 2023-03-15 NOTE — Telephone Encounter (Signed)
Clayburn Pert (Key: [id number]) PA Case ID #: [id number] Rx #: [id number] Need Help? Call us at [phone] Status sent iconSent to Plan today Drug Lidocaine 5% patches ePA cloud logo Form [employer] Electronic Prior Authorization Form [phone] NCPDP)

## 2023-03-16 ENCOUNTER — Other Ambulatory Visit: Payer: Self-pay | Admitting: Family Medicine

## 2023-03-16 ENCOUNTER — Telehealth: Payer: Self-pay | Admitting: Family

## 2023-03-16 DIAGNOSIS — M778 Other enthesopathies, not elsewhere classified: Secondary | ICD-10-CM

## 2023-03-16 NOTE — Telephone Encounter (Signed)
Prior authorization denied, alternative or appeal?

## 2023-03-16 NOTE — Telephone Encounter (Signed)
  Name from pharmacy: LIDOCAINE 5% PATCH   Pharmacy comment: Alternative Requested:REQUIRES PRIOR AUTHORIZATION.

## 2023-03-26 ENCOUNTER — Other Ambulatory Visit: Payer: Self-pay | Admitting: Family Medicine

## 2023-03-26 DIAGNOSIS — E1169 Type 2 diabetes mellitus with other specified complication: Secondary | ICD-10-CM

## 2023-04-02 ENCOUNTER — Other Ambulatory Visit: Payer: Self-pay | Admitting: Family Medicine

## 2023-04-02 DIAGNOSIS — M109 Gout, unspecified: Secondary | ICD-10-CM

## 2023-05-24 ENCOUNTER — Ambulatory Visit: Payer: Medicaid Other | Admitting: Family Medicine

## 2023-06-17 ENCOUNTER — Other Ambulatory Visit (HOSPITAL_COMMUNITY): Payer: Self-pay

## 2023-06-17 ENCOUNTER — Ambulatory Visit: Payer: Medicaid Other | Admitting: Family Medicine

## 2023-06-17 ENCOUNTER — Telehealth: Payer: Self-pay | Admitting: Family Medicine

## 2023-06-17 ENCOUNTER — Telehealth: Payer: Self-pay

## 2023-06-17 DIAGNOSIS — E1169 Type 2 diabetes mellitus with other specified complication: Secondary | ICD-10-CM

## 2023-06-17 MED ORDER — OZEMPIC (0.25 OR 0.5 MG/DOSE) 2 MG/3ML ~~LOC~~ SOPN
0.5000 mg | PEN_INJECTOR | SUBCUTANEOUS | 0 refills | Status: DC
Start: 2023-06-17 — End: 2023-09-17

## 2023-06-17 NOTE — Telephone Encounter (Signed)
Pharmacy Patient Advocate Encounter   Received notification from Pt Calls Messages that prior authorization for Lidocaine 5% patches is required/requested.   Insurance verification completed.   The patient is insured through [facility] .   Per test claim: PA required; PA submitted to [facility] via CoverMyMeds Key/confirmation #/EOC [id number] Status is pending

## 2023-06-17 NOTE — Telephone Encounter (Signed)
Pt would like for a PA to be performed. The Rx strength Lidocaine patches are the only thing that helps his shoulder pain.  The OTC does not help nor does Voltaren.

## 2023-06-17 NOTE — Telephone Encounter (Signed)
  Prescription Request  06/17/2023  Is this a "Controlled Substance" medicine? no  Have you seen your PCP in the last 2 weeks? No pt had appt for today had to resch due to weather next appt on 08/11/2023  If YES, route message to pool  -  If NO, patient needs to be scheduled for appointment.  What is the name of the medication or equipment? Semaglutide,0.25 or 0.5MG /DOS, (OZEMPIC, 0.25 OR 0.5 MG/DOSE,) 2 MG/3ML SOPN  rosuvastatin (CRESTOR) 20 MG tablet   Have you contacted your pharmacy to request a refill? no   Which pharmacy would you like this sent to? [facility]    Patient notified that their request is being sent to the clinical staff for review and that they should receive a response within 2 business days.

## 2023-06-17 NOTE — Telephone Encounter (Signed)
PA request has been Submitted. New Encounter created for follow up. For additional info see Pharmacy Prior Auth telephone encounter from 06/17/23.

## 2023-06-17 NOTE — Telephone Encounter (Signed)
Pt has been made aware. 

## 2023-06-17 NOTE — Telephone Encounter (Signed)
Sent a refill to the pharmacy for him

## 2023-06-18 NOTE — Telephone Encounter (Signed)
Pharmacy Patient Advocate Encounter  Received notification from [facility] that Prior Authorization for Lidocaine 5% patches has been DENIED. Please advise how you'd like to proceed. Full denial letter will be uploaded to the media tab. See denial reason below.   PA #/Case ID/Reference #: [id number]   Denial Reason:

## 2023-06-18 NOTE — Telephone Encounter (Signed)
Please let the patient know that his lidocaine patches were denied by his insurance and we can discuss other options in the visit but in the meantime he could buy over-the-counter or Voltaren gel

## 2023-06-21 NOTE — Telephone Encounter (Signed)
Patient aware and verbalized understanding. °

## 2023-07-18 ENCOUNTER — Other Ambulatory Visit: Payer: Self-pay | Admitting: Family Medicine

## 2023-07-18 DIAGNOSIS — M109 Gout, unspecified: Secondary | ICD-10-CM

## 2023-07-18 DIAGNOSIS — E1169 Type 2 diabetes mellitus with other specified complication: Secondary | ICD-10-CM

## 2023-07-20 ENCOUNTER — Telehealth: Payer: Self-pay | Admitting: Family Medicine

## 2023-07-20 NOTE — Telephone Encounter (Signed)
Please review

## 2023-07-21 ENCOUNTER — Other Ambulatory Visit (HOSPITAL_COMMUNITY): Payer: Self-pay

## 2023-07-21 NOTE — Telephone Encounter (Signed)
Initiated PA via [employer]. Patient is still insured with Medicaid Managed Care. PA previously denied.

## 2023-07-21 NOTE — Telephone Encounter (Signed)
Pt has been made aware.  States that his insurance changed to only MCD on 07/11/23.  The original PA was with [facility] and MCD.  Pt aware that based of C[facility] message that a PA has been initiated with [employer] and that he should hear from his pharmacy or our office with the determination.

## 2023-07-21 NOTE — Telephone Encounter (Signed)
PA has not been submitted. Could not submit PA in [employer] because it said patient is insured under a managed medicaid plan. When checking benefits in [facility], only the [facility] Medicaid came up. Medicaid still has patient under the managed medicaid plan and not traditional medicaid.

## 2023-07-23 NOTE — Telephone Encounter (Addendum)
(  Key: [id number])  Pa submitted again at pts request. States that Voltaren gel and oral form do not help at all.  Dx: tendenitis

## 2023-07-23 NOTE — Telephone Encounter (Signed)
Pharmacy Patient Advocate Encounter  Received notification from [facility] that Prior Authorization for Lidocaine 5% patches has been DENIED.  Full denial letter will be uploaded to the media tab. See denial reason below.   PA #/Case ID/Reference #: [id number]

## 2023-07-26 NOTE — Telephone Encounter (Signed)
Pt has been notified.

## 2023-07-26 NOTE — Telephone Encounter (Signed)
Please let the patient know that apparently his new insurance has declined or denied coverage for the lidocaine patches as of 3 days ago, unless his insurance has changed since that time, the let us know

## 2023-08-11 ENCOUNTER — Ambulatory Visit: Payer: Medicaid Other | Admitting: Family Medicine

## 2023-09-17 ENCOUNTER — Ambulatory Visit (INDEPENDENT_AMBULATORY_CARE_PROVIDER_SITE_OTHER): Payer: Medicaid Other | Admitting: Family Medicine

## 2023-09-17 ENCOUNTER — Encounter: Payer: Self-pay | Admitting: Family Medicine

## 2023-09-17 VITALS — BP 114/78 | HR 100 | Temp 98.3°F | Ht 73.0 in | Wt 344.0 lb

## 2023-09-17 DIAGNOSIS — E781 Pure hyperglyceridemia: Secondary | ICD-10-CM

## 2023-09-17 DIAGNOSIS — E785 Hyperlipidemia, unspecified: Secondary | ICD-10-CM

## 2023-09-17 DIAGNOSIS — I10 Essential (primary) hypertension: Secondary | ICD-10-CM | POA: Diagnosis not present

## 2023-09-17 DIAGNOSIS — Z7985 Long-term (current) use of injectable non-insulin antidiabetic drugs: Secondary | ICD-10-CM

## 2023-09-17 DIAGNOSIS — E1169 Type 2 diabetes mellitus with other specified complication: Secondary | ICD-10-CM

## 2023-09-17 DIAGNOSIS — Z7984 Long term (current) use of oral hypoglycemic drugs: Secondary | ICD-10-CM

## 2023-09-17 LAB — BAYER DCA HB A1C WAIVED: HB A1C (BAYER DCA - WAIVED): 8.3 % — ABNORMAL HIGH (ref 4.8–5.6)

## 2023-09-17 MED ORDER — AMLODIPINE BESYLATE 10 MG PO TABS: 10.0000 mg | ORAL_TABLET | Freq: Every day | ORAL | 3 refills | Status: DC

## 2023-09-17 MED ORDER — METFORMIN HCL 1000 MG PO TABS: 1000.0000 mg | ORAL_TABLET | Freq: Two times a day (BID) | ORAL | 3 refills | Status: DC

## 2023-09-17 MED ORDER — FENOFIBRATE 48 MG PO TABS: 48.0000 mg | ORAL_TABLET | Freq: Every day | ORAL | 3 refills | Status: DC

## 2023-09-17 MED ORDER — ROSUVASTATIN CALCIUM 20 MG PO TABS: 20.0000 mg | ORAL_TABLET | Freq: Every day | ORAL | 3 refills | Status: DC

## 2023-09-17 MED ORDER — LISINOPRIL 20 MG PO TABS: 20.0000 mg | ORAL_TABLET | Freq: Every day | ORAL | 3 refills | Status: DC

## 2023-09-17 MED ORDER — OZEMPIC (0.25 OR 0.5 MG/DOSE) 2 MG/3ML ~~LOC~~ SOPN: 0.5000 mg | PEN_INJECTOR | SUBCUTANEOUS | 1 refills | Status: DC

## 2023-09-17 NOTE — Progress Notes (Signed)
BP 114/78   Pulse 100   Temp 98.3 F (36.8 C)   Ht 6\' 1"  (1.854 m)   Wt (!) 344 lb (156 kg)   SpO2 96%   BMI 45.39 kg/m    Subjective:   Patient ID: Kevin Pugh, male    DOB: 11/20/1980, 42 y.o.   MRN: [id number]  HPI: Kevin Pugh is a 42 y.o. male presenting on 09/17/2023 for Medical Management of Chronic Issues   HPI Type 2 diabetes mellitus Patient comes in today for recheck of his diabetes. Patient has been currently taking metformin and Ozempic. Patient is currently on an ACE inhibitor/ARB. Patient has not seen an ophthalmologist this year. Patient denies any new issues with their feet. The symptom started onset as an adult hypertension and hyperlipidemia and obesity ARE RELATED TO DM   Hypertension Patient is currently on amlodipine and lisinopril, and their blood pressure today is 114/78. Patient denies any lightheadedness or dizziness. Patient denies headaches, blurred vision, chest pains, shortness of breath, or weakness. Denies any side effects from medication and is content with current medication.   Hyperlipidemia Patient is coming in for recheck of his hyperlipidemia. The patient is currently taking Crestor and fish oil and fenofibrate. They deny any issues with myalgias or history of liver damage from it. They deny any focal numbness or weakness or chest pain.   Relevant past medical, surgical, family and social history reviewed and updated as indicated. Interim medical history since our last visit reviewed. Allergies and medications reviewed and updated.  Review of Systems  Constitutional:  Negative for chills and fever.  Eyes:  Negative for visual disturbance.  Respiratory:  Negative for shortness of breath and wheezing.   Cardiovascular:  Negative for chest pain and leg swelling.  Musculoskeletal:  Negative for back pain and gait problem.  Skin:  Negative for rash.  Neurological:  Negative for dizziness, weakness, light-headedness and headaches.  All other  systems reviewed and are negative.   Per HPI unless specifically indicated above   Allergies as of 09/17/2023       Reactions   Prednisone Anaphylaxis, Hives, Swelling   Keflex [cephalexin Monohydrate]    Like thrush on the outside of mouth        Medication List        Accurate as of September 17, 2023  2:16 PM. If you have any questions, ask your nurse or doctor.          albuterol 108 (90 Base) MCG/ACT inhaler Commonly known as: VENTOLIN HFA Inhale 2 puffs into the lungs every 6 (six) hours as needed. For shortness of breath   amLODipine 10 MG tablet Commonly known as: NORVASC Take 1 tablet (10 mg total) by mouth daily.   cetirizine 10 MG tablet Commonly known as: ZYRTEC Take 10 mg by mouth at bedtime.   diclofenac 75 MG EC tablet Commonly known as: VOLTAREN Take 1 tablet (75 mg total) by mouth 2 (two) times daily. FOR LOWER BACK PAIN   fenofibrate 48 MG tablet Commonly known as: Tricor Take 1 tablet (48 mg total) by mouth daily.   fish oil-omega-3 fatty acids 1000 MG capsule Take 1 g by mouth daily.   lidocaine 5 % Commonly known as: LIDODERM PLACE 1 PATCH ONTO THE SKIN DAILY. REMOVE & DISCARD PATCH WITHIN 12 HOURS OR AS DIRECTED BY MD   lisinopril 20 MG tablet Commonly known as: ZESTRIL Take 1 tablet (20 mg total) by mouth daily.   metFORMIN  1000 MG tablet Commonly known as: GLUCOPHAGE Take 1 tablet (1,000 mg total) by mouth 2 (two) times daily with a meal. What changed: See the new instructions. Changed by: Elige Radon Byanca Kasper   Mitigare 0.6 MG Caps Generic drug: Colchicine Take 0.6 mg by mouth daily as needed.   colchicine 0.6 MG tablet TAKE 1 TABLET BY MOUTH EVERY DAY   Ozempic (0.25 or 0.5 MG/DOSE) 2 MG/3ML Sopn Generic drug: Semaglutide(0.25 or 0.5MG /DOS) Inject 0.5 mg into the skin once a week.   rosuvastatin 20 MG tablet Commonly known as: CRESTOR Take 1 tablet (20 mg total) by mouth daily.         Objective:   BP 114/78    Pulse 100   Temp 98.3 F (36.8 C)   Ht 6\' 1"  (1.854 m)   Wt (!) 344 lb (156 kg)   SpO2 96%   BMI 45.39 kg/m   Wt Readings from Last 3 Encounters:  09/17/23 (!) 344 lb (156 kg)  02/22/23 (!) 351 lb (159.2 kg)  11/23/22 (!) 353 lb (160.1 kg)    Physical Exam Vitals and nursing note reviewed.  Constitutional:      General: He is not in acute distress.    Appearance: He is well-developed. He is not diaphoretic.  Eyes:     General: No scleral icterus.    Conjunctiva/sclera: Conjunctivae normal.  Neck:     Thyroid: No thyromegaly.  Cardiovascular:     Rate and Rhythm: Normal rate and regular rhythm.     Heart sounds: Normal heart sounds. No murmur heard. Pulmonary:     Effort: Pulmonary effort is normal. No respiratory distress.     Breath sounds: Normal breath sounds. No wheezing.  Musculoskeletal:        General: No swelling. Normal range of motion.     Cervical back: Neck supple.  Lymphadenopathy:     Cervical: No cervical adenopathy.  Skin:    General: Skin is warm and dry.     Findings: No rash.  Neurological:     Mental Status: He is alert and oriented to person, place, and time.     Coordination: Coordination normal.  Psychiatric:        Behavior: Behavior normal.       Assessment & Plan:   Problem List Items Addressed This Visit       Cardiovascular and Mediastinum   Essential hypertension   Relevant Medications   rosuvastatin (CRESTOR) 20 MG tablet   amLODipine (NORVASC) 10 MG tablet   fenofibrate (TRICOR) 48 MG tablet   lisinopril (ZESTRIL) 20 MG tablet   Other Relevant Orders   CBC with Differential/Platelet   CMP14+EGFR   Lipid panel   Bayer DCA Hb A1c Waived     Endocrine   Type 2 diabetes mellitus with other specified complication (HCC) - Primary   Relevant Medications   rosuvastatin (CRESTOR) 20 MG tablet   metFORMIN (GLUCOPHAGE) 1000 MG tablet   lisinopril (ZESTRIL) 20 MG tablet   Semaglutide,0.25 or 0.5MG /DOS, (OZEMPIC, 0.25 OR 0.5  MG/DOSE,) 2 MG/3ML SOPN   Other Relevant Orders   CBC with Differential/Platelet   CMP14+EGFR   Lipid panel   Bayer DCA Hb A1c Waived   Vitamin B12   Hyperlipidemia associated with type 2 diabetes mellitus (HCC)   Relevant Medications   rosuvastatin (CRESTOR) 20 MG tablet   metFORMIN (GLUCOPHAGE) 1000 MG tablet   amLODipine (NORVASC) 10 MG tablet   fenofibrate (TRICOR) 48 MG tablet   lisinopril (ZESTRIL)  20 MG tablet   Semaglutide,0.25 or 0.5MG /DOS, (OZEMPIC, 0.25 OR 0.5 MG/DOSE,) 2 MG/3ML SOPN   Other Relevant Orders   CBC with Differential/Platelet   CMP14+EGFR   Lipid panel   Bayer DCA Hb A1c Waived   Other Visit Diagnoses     Hypertriglyceridemia       Relevant Medications   rosuvastatin (CRESTOR) 20 MG tablet   amLODipine (NORVASC) 10 MG tablet   fenofibrate (TRICOR) 48 MG tablet   lisinopril (ZESTRIL) 20 MG tablet       A1c is still up at 8.3, going to increase his Ozempic to 1 mg daily but patient does not want to, he wants to try diet for 3 months and then will consider it at the next visit Follow up plan: Return in about 3 months (around 12/18/2023), or if symptoms worsen or fail to improve, for Type 2 diabetes.  Counseling provided for all of the vaccine components Orders Placed This Encounter  Procedures   CBC with Differential/Platelet   CMP14+EGFR   Lipid panel   Bayer DCA Hb A1c Waived   Vitamin B12    Arville Care, MD [facility] 09/17/2023, 2:16 PM

## 2023-09-18 LAB — CMP14+EGFR
ALT: 33 [IU]/L (ref 0–44)
AST: 38 [IU]/L (ref 0–40)
Albumin: 4.3 g/dL (ref 4.1–5.1)
Alkaline Phosphatase: 107 [IU]/L (ref 44–121)
BUN/Creatinine Ratio: 9 (ref 9–20)
BUN: 10 mg/dL (ref 6–24)
Bilirubin Total: 0.5 mg/dL (ref 0.0–1.2)
CO2: 21 mmol/L (ref 20–29)
Calcium: 10 mg/dL (ref 8.7–10.2)
Chloride: 104 mmol/L (ref 96–106)
Creatinine, Ser: 1.06 mg/dL (ref 0.76–1.27)
Globulin, Total: 2.4 g/dL (ref 1.5–4.5)
Glucose: 180 mg/dL — ABNORMAL HIGH (ref 70–99)
Potassium: 4.6 mmol/L (ref 3.5–5.2)
Sodium: 142 mmol/L (ref 134–144)
Total Protein: 6.7 g/dL (ref 6.0–8.5)
eGFR: 89 mL/min/{1.73_m2} (ref >59)

## 2023-09-18 LAB — CBC WITH DIFFERENTIAL/PLATELET
Basophils Absolute: 0.1 10*3/uL (ref 0.0–0.2)
Basos: 1 %
EOS (ABSOLUTE): 0.2 10*3/uL (ref 0.0–0.4)
Eos: 2 %
Hematocrit: 43.1 % (ref 37.5–51.0)
Hemoglobin: 13.8 g/dL (ref 13.0–17.7)
Immature Grans (Abs): 0 10*3/uL (ref 0.0–0.1)
Immature Granulocytes: 0 %
Lymphocytes Absolute: 2.4 10*3/uL (ref 0.7–3.1)
Lymphs: 32 %
MCH: 29.5 pg (ref 26.6–33.0)
MCHC: 32 g/dL (ref 31.5–35.7)
MCV: 92 fL (ref 79–97)
Monocytes Absolute: 0.5 10*3/uL (ref 0.1–0.9)
Monocytes: 7 %
Neutrophils Absolute: 4.4 10*3/uL (ref 1.4–7.0)
Neutrophils: 58 %
Platelets: 229 10*3/uL (ref 150–450)
RBC: 4.68 x10E6/uL (ref 4.14–5.80)
RDW: 12.8 % (ref 11.6–15.4)
WBC: 7.5 10*3/uL (ref 3.4–10.8)

## 2023-09-18 LAB — LIPID PANEL
Chol/HDL Ratio: 4.4 ratio (ref 0.0–5.0)
Cholesterol, Total: 136 mg/dL (ref 100–199)
HDL: 31 mg/dL — ABNORMAL LOW (ref >39)
LDL Chol Calc (NIH): 49 mg/dL (ref 0–99)
Triglycerides: 371 mg/dL — ABNORMAL HIGH (ref 0–149)
VLDL Cholesterol Cal: 56 mg/dL — ABNORMAL HIGH (ref 5–40)

## 2023-09-18 LAB — VITAMIN B12: Vitamin B-12: 282 pg/mL (ref 232–1245)

## 2023-10-13 ENCOUNTER — Other Ambulatory Visit: Payer: Self-pay | Admitting: Family Medicine

## 2023-10-13 DIAGNOSIS — M109 Gout, unspecified: Secondary | ICD-10-CM

## 2023-12-20 ENCOUNTER — Ambulatory Visit: Payer: Medicaid Other | Admitting: Family Medicine

## 2024-01-13 ENCOUNTER — Ambulatory Visit: Payer: Medicaid Other | Admitting: Family Medicine

## 2024-01-18 ENCOUNTER — Telehealth: Payer: Self-pay | Admitting: Pharmacy Technician

## 2024-01-18 ENCOUNTER — Other Ambulatory Visit (HOSPITAL_COMMUNITY): Payer: Self-pay

## 2024-01-18 NOTE — Telephone Encounter (Signed)
 Pharmacy Patient Advocate Encounter   Received notification from CoverMyMeds that prior authorization for [facility] 0.5MG  is due for renewal.   Insurance verification completed.   The patient is insured through [facility].  [facility]  SUBMITTED AND PENDING

## 2024-01-19 ENCOUNTER — Other Ambulatory Visit (HOSPITAL_COMMUNITY): Payer: Self-pay

## 2024-01-19 NOTE — Telephone Encounter (Signed)
 Pharmacy Patient Advocate Encounter  Received notification from [facility] that Prior Authorization for [facility] 2MG  has been DENIED.  Full denial letter will be uploaded to the media tab. See denial reason below.   PA #/Case ID/Reference #: [id number]

## 2024-02-07 ENCOUNTER — Ambulatory Visit (INDEPENDENT_AMBULATORY_CARE_PROVIDER_SITE_OTHER): Admitting: Family Medicine

## 2024-02-07 ENCOUNTER — Telehealth: Payer: Self-pay | Admitting: Family Medicine

## 2024-02-07 ENCOUNTER — Encounter: Payer: Self-pay | Admitting: Family Medicine

## 2024-02-07 ENCOUNTER — Telehealth: Payer: Self-pay

## 2024-02-07 ENCOUNTER — Other Ambulatory Visit (HOSPITAL_COMMUNITY): Payer: Self-pay

## 2024-02-07 VITALS — BP 128/81 | HR 96 | Ht 73.0 in | Wt 342.0 lb

## 2024-02-07 DIAGNOSIS — E1169 Type 2 diabetes mellitus with other specified complication: Secondary | ICD-10-CM

## 2024-02-07 DIAGNOSIS — E785 Hyperlipidemia, unspecified: Secondary | ICD-10-CM | POA: Diagnosis not present

## 2024-02-07 DIAGNOSIS — M109 Gout, unspecified: Secondary | ICD-10-CM | POA: Diagnosis not present

## 2024-02-07 DIAGNOSIS — I10 Essential (primary) hypertension: Secondary | ICD-10-CM

## 2024-02-07 DIAGNOSIS — Z7984 Long term (current) use of oral hypoglycemic drugs: Secondary | ICD-10-CM

## 2024-02-07 DIAGNOSIS — Z7985 Long-term (current) use of injectable non-insulin antidiabetic drugs: Secondary | ICD-10-CM

## 2024-02-07 LAB — LIPID PANEL

## 2024-02-07 LAB — BAYER DCA HB A1C WAIVED: HB A1C (BAYER DCA - WAIVED): 8.4 % — ABNORMAL HIGH (ref 4.8–5.6)

## 2024-02-07 MED ORDER — MITIGARE 0.6 MG PO CAPS
0.6000 mg | ORAL_CAPSULE | Freq: Every day | ORAL | 3 refills | Status: DC | PRN
Start: 2024-02-07 — End: 2024-02-07

## 2024-02-07 MED ORDER — COLCHICINE 0.6 MG PO TABS: 0.6000 mg | ORAL_TABLET | Freq: Every day | ORAL | 3 refills | Status: AC

## 2024-02-07 NOTE — Telephone Encounter (Signed)
  Name from pharmacy: MITIGARE 0.6 MG CAPSULE   Pharmacy comment: Alternative Requested:REQUIRES PRIOR AUTH.

## 2024-02-07 NOTE — Progress Notes (Signed)
 BP 128/81   Pulse 96   Ht 6\' 1"  (1.854 m)   Wt (!) 342 lb (155.1 kg)   SpO2 97%   BMI 45.12 kg/m    Subjective:   Patient ID: [name], male    DOB: 04-17-1981, 43 y.o.   MRN: [id number]  HPI: Kevin Pugh is a 43 y.o. male presenting on 02/07/2024 for Medical Management of Chronic Issues and Diabetes   HPI Type 2 diabetes mellitus Patient comes in today for recheck of his diabetes. Patient has been currently taking Ozempic and metformin. Patient is currently on an ACE inhibitor/ARB. Patient has not seen an ophthalmologist this year. Patient denies any new issues with their feet. The symptom started onset as an adult hypertension and hyperlipidemia ARE RELATED TO DM   Hypertension Patient is currently on lisinopril and amlodipine, and their blood pressure today is 128/81. Patient denies any lightheadedness or dizziness. Patient denies headaches, blurred vision, chest pains, shortness of breath, or weakness. Denies any side effects from medication and is content with current medication.   Hyperlipidemia Patient is coming in for recheck of his hyperlipidemia. The patient is currently taking Crestor. They deny any issues with myalgias or history of liver damage from it. They deny any focal numbness or weakness or chest pain.   Gout Last attack: About a month ago Attacks this year: Gets attacks about once a month but they are all minor and much more controlled Medication: Colchicine daily Location of attacks: Right foot and sometimes rarely left foot  Relevant past medical, surgical, family and social history reviewed and updated as indicated. Interim medical history since our last visit reviewed. Allergies and medications reviewed and updated.  Review of Systems  Constitutional:  Negative for chills and fever.  Eyes:  Negative for visual disturbance.  Respiratory:  Negative for shortness of breath and wheezing.   Cardiovascular:  Negative for chest pain and leg swelling.   Musculoskeletal:  Negative for back pain and gait problem.  Skin:  Negative for rash.  All other systems reviewed and are negative.   Per HPI unless specifically indicated above   Allergies as of 02/07/2024       Reactions   Prednisone Anaphylaxis, Hives, Swelling   Keflex [cephalexin Monohydrate]    Like thrush on the outside of mouth        Medication List        Accurate as of February 07, 2024 11:05 AM. If you have any questions, ask your nurse or doctor.          STOP taking these medications    lidocaine 5 % Commonly known as: LIDODERM Stopped by: Elige Radon Ceasia Elwell       TAKE these medications    albuterol 108 (90 Base) MCG/ACT inhaler Commonly known as: VENTOLIN HFA Inhale 2 puffs into the lungs every 6 (six) hours as needed. For shortness of breath   amLODipine 10 MG tablet Commonly known as: NORVASC Take 1 tablet (10 mg total) by mouth daily.   cetirizine 10 MG tablet Commonly known as: ZYRTEC Take 10 mg by mouth at bedtime.   colchicine 0.6 MG tablet TAKE 1 TABLET BY MOUTH EVERY DAY   Mitigare 0.6 MG Caps Generic drug: Colchicine Take 1 capsule (0.6 mg total) by mouth daily as needed.   diclofenac 75 MG EC tablet Commonly known as: VOLTAREN Take 1 tablet (75 mg total) by mouth 2 (two) times daily. FOR LOWER BACK PAIN   fenofibrate 48  MG tablet Commonly known as: Tricor Take 1 tablet (48 mg total) by mouth daily.   fish oil-omega-3 fatty acids 1000 MG capsule Take 1 g by mouth daily.   lisinopril 20 MG tablet Commonly known as: ZESTRIL Take 1 tablet (20 mg total) by mouth daily.   metFORMIN 1000 MG tablet Commonly known as: GLUCOPHAGE Take 1 tablet (1,000 mg total) by mouth 2 (two) times daily with a meal.   Ozempic (0.25 or 0.5 MG/DOSE) 2 MG/3ML Sopn Generic drug: Semaglutide(0.25 or 0.5MG /DOS) Inject 0.5 mg into the skin once a week.   rosuvastatin 20 MG tablet Commonly known as: CRESTOR Take 1 tablet (20 mg total) by mouth  daily.         Objective:   BP 128/81   Pulse 96   Ht 6\' 1"  (1.854 m)   Wt (!) 342 lb (155.1 kg)   SpO2 97%   BMI 45.12 kg/m   Wt Readings from Last 3 Encounters:  02/07/24 (!) 342 lb (155.1 kg)  09/17/23 (!) 344 lb (156 kg)  02/22/23 (!) 351 lb (159.2 kg)    Physical Exam Vitals and nursing note reviewed.  Constitutional:      General: He is not in acute distress.    Appearance: He is well-developed. He is not diaphoretic.  Eyes:     General: No scleral icterus.    Conjunctiva/sclera: Conjunctivae normal.  Neck:     Thyroid: No thyromegaly.  Cardiovascular:     Rate and Rhythm: Normal rate and regular rhythm.     Heart sounds: Normal heart sounds. No murmur heard. Pulmonary:     Effort: Pulmonary effort is normal. No respiratory distress.     Breath sounds: Normal breath sounds. No wheezing.  Musculoskeletal:        General: No swelling. Normal range of motion.     Cervical back: Neck supple.  Lymphadenopathy:     Cervical: No cervical adenopathy.  Skin:    General: Skin is warm and dry.     Findings: No rash.  Neurological:     Mental Status: He is alert and oriented to person, place, and time.     Coordination: Coordination normal.  Psychiatric:        Behavior: Behavior normal.       Assessment & Plan:   Problem List Items Addressed This Visit       Cardiovascular and Mediastinum   Essential hypertension   Relevant Orders   CBC with Differential/Platelet   CMP14+EGFR   Lipid panel   Bayer DCA Hb A1c Waived   PSA, total and free     Endocrine   Type 2 diabetes mellitus with other specified complication (HCC) - Primary   Relevant Orders   CBC with Differential/Platelet   CMP14+EGFR   Lipid panel   Bayer DCA Hb A1c Waived   PSA, total and free   Hyperlipidemia associated with type 2 diabetes mellitus (HCC)   Relevant Orders   CBC with Differential/Platelet   CMP14+EGFR   Lipid panel   Bayer DCA Hb A1c Waived   PSA, total and free      Other   Gout   Relevant Orders   Microalbumin/Creatinine Ratio, Urine    A1c is 8.4 again.  Has had some recent illnesses in the family.  Blood sugars have been recently better in the mornings.  When his son was ill done he probably ran up. Follow up plan: Return in about 3 months (around 05/08/2024), or if symptoms  worsen or fail to improve, for Diabetes and hypertension cholesterol.  Counseling provided for all of the vaccine components Orders Placed This Encounter  Procedures   CBC with Differential/Platelet   CMP14+EGFR   Lipid panel   Bayer DCA Hb A1c Waived   PSA, total and free   Microalbumin/Creatinine Ratio, Urine    Arville Care, MD [facility] 02/07/2024, 11:05 AM

## 2024-02-07 NOTE — Telephone Encounter (Signed)
 Sent colchicine for the patient because Mitigare was not covered

## 2024-02-07 NOTE — Telephone Encounter (Signed)
 Mitigare removed from pts med list.

## 2024-02-07 NOTE — Telephone Encounter (Signed)
 PA request has been Started. New Encounter has been or will be created for follow up. For additional info see Pharmacy Prior Auth telephone encounter from 02/07/24.

## 2024-02-07 NOTE — Addendum Note (Signed)
 Addended by: Arville Care on: 02/07/2024 04:01 PM   Modules accepted: Orders

## 2024-02-07 NOTE — Telephone Encounter (Signed)
 Pharmacy Patient Advocate Encounter   Received notification from Pt Calls Messages that prior authorization for Mitigare 0.6MG  capsules is required/requested.   Insurance verification completed.   The patient is insured through [facility] MEDICAID.   Per test claim:  COLCHICINE 0.6MG  TABLETS,  is preferred by the insurance.  If suggested medication is appropriate, Please send in a new RX and discontinue this one. If not, please advise as to why it's not appropriate so that we may request a Prior Authorization. Please note, some preferred medications may still require a PA.  If the suggested medications have not been trialed and there are no contraindications to their use, the PA will not be submitted, as it will not be approved.   Ran test claim for Colchicine Tablets, received paid claim. Patient's copay will be $4 for 90 day supply.

## 2024-02-08 LAB — CBC WITH DIFFERENTIAL/PLATELET
Basophils Absolute: 0.1 10*3/uL (ref 0.0–0.2)
Basos: 1 %
EOS (ABSOLUTE): 0.1 10*3/uL (ref 0.0–0.4)
Eos: 2 %
Hematocrit: 42.6 % (ref 37.5–51.0)
Hemoglobin: 13.8 g/dL (ref 13.0–17.7)
Immature Grans (Abs): 0 10*3/uL (ref 0.0–0.1)
Immature Granulocytes: 1 %
Lymphocytes Absolute: 2.2 10*3/uL (ref 0.7–3.1)
Lymphs: 37 %
MCH: 29.9 pg (ref 26.6–33.0)
MCHC: 32.4 g/dL (ref 31.5–35.7)
MCV: 92 fL (ref 79–97)
Monocytes Absolute: 0.4 10*3/uL (ref 0.1–0.9)
Monocytes: 6 %
Neutrophils Absolute: 3.2 10*3/uL (ref 1.4–7.0)
Neutrophils: 53 %
Platelets: 197 10*3/uL (ref 150–450)
RBC: 4.61 x10E6/uL (ref 4.14–5.80)
RDW: 13.1 % (ref 11.6–15.4)
WBC: 6 10*3/uL (ref 3.4–10.8)

## 2024-02-08 LAB — CMP14+EGFR
ALT: 32 IU/L (ref 0–44)
AST: 42 IU/L — ABNORMAL HIGH (ref 0–40)
Albumin: 4.5 g/dL (ref 4.1–5.1)
Alkaline Phosphatase: 95 IU/L (ref 44–121)
BUN/Creatinine Ratio: 10 (ref 9–20)
BUN: 11 mg/dL (ref 6–24)
Bilirubin Total: 0.6 mg/dL (ref 0.0–1.2)
CO2: 17 mmol/L — ABNORMAL LOW (ref 20–29)
Calcium: 9.7 mg/dL (ref 8.7–10.2)
Chloride: 102 mmol/L (ref 96–106)
Creatinine, Ser: 1.09 mg/dL (ref 0.76–1.27)
Globulin, Total: 2.3 g/dL (ref 1.5–4.5)
Glucose: 242 mg/dL — ABNORMAL HIGH (ref 70–99)
Potassium: 4.9 mmol/L (ref 3.5–5.2)
Sodium: 137 mmol/L (ref 134–144)
Total Protein: 6.8 g/dL (ref 6.0–8.5)
eGFR: 86 mL/min/{1.73_m2} (ref >59)

## 2024-02-08 LAB — LIPID PANEL
Cholesterol, Total: 130 mg/dL (ref 100–199)
HDL: 31 mg/dL — ABNORMAL LOW (ref >39)
LDL CALC COMMENT:: 4.2 ratio (ref 0.0–5.0)
LDL Chol Calc (NIH): 51 mg/dL (ref 0–99)
Triglycerides: 308 mg/dL — ABNORMAL HIGH (ref 0–149)
VLDL Cholesterol Cal: 48 mg/dL — ABNORMAL HIGH (ref 5–40)

## 2024-02-08 LAB — PSA, TOTAL AND FREE
PSA, Free Pct: 65 %
PSA, Free: 0.13 ng/mL
Prostate Specific Ag, Serum: 0.2 ng/mL (ref 0.0–4.0)

## 2024-02-09 ENCOUNTER — Other Ambulatory Visit (HOSPITAL_COMMUNITY): Payer: Self-pay

## 2024-02-09 ENCOUNTER — Telehealth: Payer: Self-pay | Admitting: Family Medicine

## 2024-02-14 ENCOUNTER — Other Ambulatory Visit (HOSPITAL_COMMUNITY): Payer: Self-pay

## 2024-02-14 ENCOUNTER — Telehealth: Payer: Self-pay | Admitting: Pharmacy Technician

## 2024-02-14 NOTE — Telephone Encounter (Signed)
 Pharmacy Patient Advocate Encounter   Received notification from CoverMyMeds that prior authorization for Ozempic (0.25 or 0.5 MG/DOSE) 2MG /3ML pen-injectors is required/requested.   Insurance verification completed.   The patient is insured through [facility] MEDICAID .   Per test claim: PA required; PA submitted to above mentioned insurance via CoverMyMeds Key/confirmation #/EOC [id number] Status is pending

## 2024-02-15 NOTE — Telephone Encounter (Signed)
 Pharmacy Patient Advocate Encounter  Received notification from [facility] MEDICAID that Prior Authorization for Ozempic (0.25 or 0.5 MG/DOSE) 2MG /3ML pen-injectors has been DENIED.  Full denial letter will be uploaded to the media tab. See denial reason below.   PA #/Case ID/Reference #: [id number]

## 2024-02-16 ENCOUNTER — Telehealth: Payer: Self-pay

## 2024-02-16 NOTE — Telephone Encounter (Signed)
 PA request has been  RESUBMITTED . New Encounter has been or will be created for follow up. For additional info see Pharmacy Prior Auth telephone encounter from 02/16/24.

## 2024-02-16 NOTE — Telephone Encounter (Signed)
 I do not know if we can get them to rerun this but he is just started on the lower dose of Ozempic and we are working to get him up higher, it is kept his blood sugars stable but we do need to go up on it more.  I do think it is helping him with the diabetes but I do think he needs more

## 2024-02-16 NOTE — Telephone Encounter (Signed)
 Pharmacy Patient Advocate Encounter   Received notification from Pt Calls Messages that prior authorization for Ozempic (0.25 or 0.5 MG/DOSE) 2MG /3ML pen-injectors is required/requested.   Insurance verification completed.   The patient is insured through [facility] MEDICAID .   Per test claim: PA required; PA submitted to above mentioned insurance via CoverMyMeds Key/confirmation #/EOC  [id number] Status is pending

## 2024-02-17 ENCOUNTER — Encounter: Payer: Self-pay | Admitting: Family Medicine

## 2024-02-17 NOTE — Telephone Encounter (Signed)
 Pharmacy Patient Advocate Encounter  Received notification from [facility] that Prior Authorization for Ozempic (0.25 or 0.5 MG/DOSE) 2MG /3ML pen-injectors  has been APPROVED from 02/16/24 to 02/15/25   PA #/Case ID/Reference #: [id number]

## 2024-05-15 ENCOUNTER — Ambulatory Visit: Admitting: Family Medicine

## 2024-05-17 ENCOUNTER — Encounter: Payer: Self-pay | Admitting: Family Medicine

## 2024-09-11 ENCOUNTER — Other Ambulatory Visit: Payer: Self-pay | Admitting: *Deleted

## 2024-09-11 DIAGNOSIS — E781 Pure hyperglyceridemia: Secondary | ICD-10-CM

## 2024-09-11 DIAGNOSIS — E1169 Type 2 diabetes mellitus with other specified complication: Secondary | ICD-10-CM

## 2024-09-28 ENCOUNTER — Other Ambulatory Visit: Payer: Self-pay | Admitting: Family Medicine

## 2024-09-28 DIAGNOSIS — I10 Essential (primary) hypertension: Secondary | ICD-10-CM

## 2024-10-01 ENCOUNTER — Other Ambulatory Visit: Payer: Self-pay | Admitting: Family Medicine

## 2024-10-01 DIAGNOSIS — E1169 Type 2 diabetes mellitus with other specified complication: Secondary | ICD-10-CM

## 2024-10-07 ENCOUNTER — Other Ambulatory Visit: Payer: Self-pay | Admitting: Family Medicine

## 2024-10-07 DIAGNOSIS — E781 Pure hyperglyceridemia: Secondary | ICD-10-CM

## 2024-10-07 DIAGNOSIS — E1169 Type 2 diabetes mellitus with other specified complication: Secondary | ICD-10-CM

## 2024-10-08 ENCOUNTER — Other Ambulatory Visit: Payer: Self-pay | Admitting: Family Medicine

## 2024-10-08 DIAGNOSIS — E1169 Type 2 diabetes mellitus with other specified complication: Secondary | ICD-10-CM

## 2024-10-09 ENCOUNTER — Other Ambulatory Visit: Payer: Self-pay | Admitting: Family Medicine

## 2024-10-09 DIAGNOSIS — E1169 Type 2 diabetes mellitus with other specified complication: Secondary | ICD-10-CM

## 2024-10-09 MED ORDER — FENOFIBRATE 48 MG PO TABS: 48.0000 mg | ORAL_TABLET | Freq: Every day | ORAL | 0 refills | Status: DC

## 2024-10-09 NOTE — Addendum Note (Signed)
 Addended by: Dane Kopke D on: 10/09/2024 01:13 PM   Modules accepted: Orders

## 2024-10-09 NOTE — Telephone Encounter (Signed)
 Pt scheduled for 11/29/2024, please send refill to [facility]

## 2024-10-09 NOTE — Telephone Encounter (Signed)
 [place] pt NTBS 30-d given 11/22/23

## 2024-10-12 ENCOUNTER — Other Ambulatory Visit: Payer: Self-pay | Admitting: Family Medicine

## 2024-10-12 DIAGNOSIS — E1169 Type 2 diabetes mellitus with other specified complication: Secondary | ICD-10-CM

## 2024-10-12 NOTE — Telephone Encounter (Unsigned)
 Copied from CRM #[id number]. Topic: Clinical - Medication Refill >> Oct 12, 2024  4:11 PM Sophia H wrote: Medication: rosuvastatin  (CRESTOR ) 20 MG tablet   Has the patient contacted their pharmacy? Yes, patient needing updated RX  This is the patient's preferred pharmacy:  [id number] - [place], [place] - [address] [place] [address] Phone: [phone] Fax: [phone]   Is this the correct pharmacy for this prescription? Yes If no, delete pharmacy and type the correct one.   Has the prescription been filled recently? Yes  Is the patient out of the medication? Will run out this weekend  Has the patient been seen for an appointment in the last year OR does the patient have an upcoming appointment? Yes, appt January 21   Can we respond through MyChart? Yes  Agent: Please be advised that Rx refills may take up to 3 business days. We ask that you follow-up with your pharmacy.

## 2024-10-16 ENCOUNTER — Telehealth: Payer: Self-pay | Admitting: Family Medicine

## 2024-10-16 DIAGNOSIS — E1169 Type 2 diabetes mellitus with other specified complication: Secondary | ICD-10-CM

## 2024-10-16 MED ORDER — ROSUVASTATIN CALCIUM 20 MG PO TABS: 20.0000 mg | ORAL_TABLET | Freq: Every day | ORAL | 0 refills | Status: DC

## 2024-10-16 MED ORDER — ROSUVASTATIN CALCIUM 20 MG PO TABS: 20.0000 mg | ORAL_TABLET | Freq: Every day | ORAL | 3 refills | Status: DC

## 2024-10-16 NOTE — Telephone Encounter (Signed)
 Copied from CRM #[id number]. Topic: Clinical - Prescription Issue >> Oct 16, 2024 10:54 AM Alfonso ORN wrote: Reason for CRM: patient calling on status of the medication refill for the rosuvastatin  (CRESTOR ) 20 MG tablet and patient is now  out of the medication  Please let pt know the status  Agent let pt know if may take up to  3 business days

## 2024-10-16 NOTE — Telephone Encounter (Signed)
 90d supply of Crestor  sent. Pt has f/u in Jan

## 2024-10-23 ENCOUNTER — Other Ambulatory Visit: Payer: Self-pay

## 2024-10-23 ENCOUNTER — Encounter: Payer: Self-pay | Admitting: Family Medicine

## 2024-10-23 DIAGNOSIS — E1169 Type 2 diabetes mellitus with other specified complication: Secondary | ICD-10-CM

## 2024-10-23 MED ORDER — METFORMIN HCL 1000 MG PO TABS: 1000.0000 mg | ORAL_TABLET | Freq: Two times a day (BID) | ORAL | 0 refills | Status: DC

## 2024-10-23 NOTE — Telephone Encounter (Unsigned)
 Copied from CRM #[id number]. Topic: Clinical - Prescription Issue >> Oct 23, 2024  9:06 AM Marda MATSU wrote: Reason for CRM: Patient calling asking for his medication: metFORMIN  (GLUCOPHAGE ) 1000 MG tablet. I did inform him that he needs to be seen, according to the chart note. He is upset and would like a call back.   Please advise

## 2024-10-23 NOTE — Telephone Encounter (Signed)
 This has been addressed.

## 2024-10-25 ENCOUNTER — Other Ambulatory Visit: Payer: Self-pay | Admitting: Family Medicine

## 2024-10-25 DIAGNOSIS — I10 Essential (primary) hypertension: Secondary | ICD-10-CM

## 2024-10-28 ENCOUNTER — Other Ambulatory Visit: Payer: Self-pay | Admitting: Family Medicine

## 2024-10-28 DIAGNOSIS — E1169 Type 2 diabetes mellitus with other specified complication: Secondary | ICD-10-CM

## 2024-11-29 ENCOUNTER — Ambulatory Visit: Admitting: Family Medicine

## 2024-11-29 ENCOUNTER — Encounter: Payer: Self-pay | Admitting: Family Medicine

## 2024-11-29 VITALS — BP 144/84 | HR 104 | Ht 73.0 in | Wt 342.0 lb

## 2024-11-29 DIAGNOSIS — Z6841 Body Mass Index (BMI) 40.0 and over, adult: Secondary | ICD-10-CM

## 2024-11-29 DIAGNOSIS — E785 Hyperlipidemia, unspecified: Secondary | ICD-10-CM

## 2024-11-29 DIAGNOSIS — I10 Essential (primary) hypertension: Secondary | ICD-10-CM | POA: Diagnosis not present

## 2024-11-29 DIAGNOSIS — E1159 Type 2 diabetes mellitus with other circulatory complications: Secondary | ICD-10-CM | POA: Diagnosis not present

## 2024-11-29 DIAGNOSIS — E1169 Type 2 diabetes mellitus with other specified complication: Secondary | ICD-10-CM | POA: Diagnosis not present

## 2024-11-29 DIAGNOSIS — Z7984 Long term (current) use of oral hypoglycemic drugs: Secondary | ICD-10-CM | POA: Diagnosis not present

## 2024-11-29 DIAGNOSIS — I152 Hypertension secondary to endocrine disorders: Secondary | ICD-10-CM | POA: Diagnosis not present

## 2024-11-29 DIAGNOSIS — Z7985 Long-term (current) use of injectable non-insulin antidiabetic drugs: Secondary | ICD-10-CM

## 2024-11-29 DIAGNOSIS — E781 Pure hyperglyceridemia: Secondary | ICD-10-CM | POA: Diagnosis not present

## 2024-11-29 LAB — LIPID PANEL

## 2024-11-29 LAB — BAYER DCA HB A1C WAIVED: HB A1C (BAYER DCA - WAIVED): 6.7 % — ABNORMAL HIGH (ref 4.8–5.6)

## 2024-11-29 MED ORDER — LISINOPRIL 20 MG PO TABS: 20.0000 mg | ORAL_TABLET | Freq: Every day | ORAL | 1 refills | Status: AC

## 2024-11-29 MED ORDER — OZEMPIC (0.25 OR 0.5 MG/DOSE) 2 MG/3ML ~~LOC~~ SOPN: 0.5000 mg | PEN_INJECTOR | SUBCUTANEOUS | 3 refills | Status: AC

## 2024-11-29 MED ORDER — AMLODIPINE BESYLATE 10 MG PO TABS: 10.0000 mg | ORAL_TABLET | Freq: Every day | ORAL | 1 refills | Status: AC

## 2024-11-29 MED ORDER — ROSUVASTATIN CALCIUM 20 MG PO TABS: 20.0000 mg | ORAL_TABLET | Freq: Every day | ORAL | 1 refills | Status: AC

## 2024-11-29 MED ORDER — FENOFIBRATE 48 MG PO TABS: 48.0000 mg | ORAL_TABLET | Freq: Every day | ORAL | 1 refills | Status: AC

## 2024-11-29 MED ORDER — METFORMIN HCL 1000 MG PO TABS: 1000.0000 mg | ORAL_TABLET | Freq: Two times a day (BID) | ORAL | 1 refills | Status: AC

## 2024-11-29 NOTE — Progress Notes (Signed)
 "  BP (!) 144/84   Pulse (!) 104   Ht 6' 1 (1.854 m)   Wt (!) 342 lb (155.1 kg)   SpO2 98%   BMI 45.12 kg/m    Subjective:   Patient ID: Kevin Pugh, male    DOB: 1981/03/01, 44 y.o.   MRN: [id number]  HPI: Kevin Pugh is a 44 y.o. male presenting on 11/29/2024 for Medical Management of Chronic Issues, Diabetes, and Hypertension   Discussed the use of AI scribe software for clinical note transcription with the patient, who gave verbal consent to proceed.  History of Present Illness   Kevin Pugh is a 44 year old male with diabetes and hypertension who presents for a recheck of his conditions.  Hyperglycemia - Morning blood glucose levels range from 150 to 200 mg/dL - Last hemoglobin J8r was 8% - Continues metformin  and Ozempic  without adverse effects  Hypertension - Blood pressure readings at home typically range from 120-130/80s mmHg - Clinic blood pressure readings have been slightly elevated, attributed to recent physical exertion - Currently taking amlodipine  for blood pressure management  Gout - Occasional joint pain without major flare-ups - No limitation in activity due to gout symptoms - Uses lidocaine  and dual-action Tylenol  for pain management  Shoulder discomfort - Shoulder discomfort present - Prefers not to pursue further evaluation - Manages symptoms with over-the-counter medications  Hyperlipidemia - Currently taking Crestor  and Tricor  for cholesterol management - Previously had issues with medication refills due to missed appointments, now resolved  Medication tolerance - No major issues with current medications          Relevant past medical, surgical, family and social history reviewed and updated as indicated. Interim medical history since our last visit reviewed. Allergies and medications reviewed and updated.  Review of Systems  Constitutional:  Negative for chills and fever.  Respiratory:  Negative for shortness of breath and  wheezing.   Cardiovascular:  Negative for chest pain and leg swelling.  Musculoskeletal:  Positive for arthralgias. Negative for back pain and gait problem.  Skin:  Negative for rash.  All other systems reviewed and are negative.   Per HPI unless specifically indicated above   Allergies as of 11/29/2024       Reactions   Prednisone Anaphylaxis, Hives, Swelling   Keflex [cephalexin Monohydrate]    Like thrush on the outside of mouth        Medication List        Accurate as of November 29, 2024  2:14 PM. If you have any questions, ask your nurse or doctor.          albuterol  108 (90 Base) MCG/ACT inhaler Commonly known as: VENTOLIN  HFA Inhale 2 puffs into the lungs every 6 (six) hours as needed. For shortness of breath   amLODipine  10 MG tablet Commonly known as: NORVASC  Take 1 tablet (10 mg total) by mouth daily.   cetirizine 10 MG tablet Commonly known as: ZYRTEC Take 10 mg by mouth at bedtime.   colchicine  0.6 MG tablet Take 1 tablet (0.6 mg total) by mouth daily.   diclofenac  75 MG EC tablet Commonly known as: VOLTAREN  Take 1 tablet (75 mg total) by mouth 2 (two) times daily. FOR LOWER BACK PAIN   fenofibrate  48 MG tablet Commonly known as: TRICOR  Take 1 tablet (48 mg total) by mouth daily.   fish oil-omega-3 fatty acids 1000 MG capsule Take 1 g by mouth daily.   lisinopril  20 MG tablet  Commonly known as: ZESTRIL  Take 1 tablet (20 mg total) by mouth daily.   metFORMIN  1000 MG tablet Commonly known as: GLUCOPHAGE  Take 1 tablet (1,000 mg total) by mouth 2 (two) times daily with a meal.   Ozempic  (0.25 or 0.5 MG/DOSE) 2 MG/3ML Sopn Generic drug: Semaglutide (0.25 or 0.5MG /DOS) Inject 0.5 mg into the skin once a week.   rosuvastatin  20 MG tablet Commonly known as: CRESTOR  Take 1 tablet (20 mg total) by mouth daily.         Objective:   BP (!) 144/84   Pulse (!) 104   Ht 6' 1 (1.854 m)   Wt (!) 342 lb (155.1 kg)   SpO2 98%   BMI 45.12  kg/m   Wt Readings from Last 3 Encounters:  11/29/24 (!) 342 lb (155.1 kg)  02/07/24 (!) 342 lb (155.1 kg)  09/17/23 (!) 344 lb (156 kg)    Physical Exam Physical Exam   NECK: Thyroid normal, no lumps. CHEST: Lungs clear to auscultation. CARDIOVASCULAR: Heart regular rate and rhythm. EXTREMITIES: Feet normal.         Assessment & Plan:   Problem List Items Addressed This Visit       Cardiovascular and Mediastinum   Hypertension associated with diabetes (HCC)   Relevant Medications   amLODipine  (NORVASC ) 10 MG tablet   fenofibrate  (TRICOR ) 48 MG tablet   lisinopril  (ZESTRIL ) 20 MG tablet   metFORMIN  (GLUCOPHAGE ) 1000 MG tablet   rosuvastatin  (CRESTOR ) 20 MG tablet   Semaglutide ,0.25 or 0.5MG /DOS, (OZEMPIC , 0.25 OR 0.5 MG/DOSE,) 2 MG/3ML SOPN     Endocrine   Type 2 diabetes mellitus with other specified complication (HCC)   Relevant Medications   lisinopril  (ZESTRIL ) 20 MG tablet   metFORMIN  (GLUCOPHAGE ) 1000 MG tablet   rosuvastatin  (CRESTOR ) 20 MG tablet   Semaglutide ,0.25 or 0.5MG /DOS, (OZEMPIC , 0.25 OR 0.5 MG/DOSE,) 2 MG/3ML SOPN   Other Relevant Orders   Microalbumin / creatinine urine ratio   Hyperlipidemia associated with type 2 diabetes mellitus (HCC) - Primary   Relevant Medications   amLODipine  (NORVASC ) 10 MG tablet   fenofibrate  (TRICOR ) 48 MG tablet   lisinopril  (ZESTRIL ) 20 MG tablet   metFORMIN  (GLUCOPHAGE ) 1000 MG tablet   rosuvastatin  (CRESTOR ) 20 MG tablet   Semaglutide ,0.25 or 0.5MG /DOS, (OZEMPIC , 0.25 OR 0.5 MG/DOSE,) 2 MG/3ML SOPN   Other Relevant Orders   Bayer DCA Hb A1c Waived   CBC with Differential/Platelet   CMP14+EGFR   Lipid panel   Vitamin B12   TSH     Other   Severe obesity (BMI >= 40) (HCC)   Relevant Medications   metFORMIN  (GLUCOPHAGE ) 1000 MG tablet   Semaglutide ,0.25 or 0.5MG /DOS, (OZEMPIC , 0.25 OR 0.5 MG/DOSE,) 2 MG/3ML SOPN   Other Visit Diagnoses       Hypertriglyceridemia       Relevant Medications   amLODipine   (NORVASC ) 10 MG tablet   fenofibrate  (TRICOR ) 48 MG tablet   lisinopril  (ZESTRIL ) 20 MG tablet   rosuvastatin  (CRESTOR ) 20 MG tablet          Type 2 diabetes mellitus with complications Blood glucose generally well-controlled with occasional morning hyperglycemia. A1c at 8% indicates suboptimal control. - Continue metformin  and Ozempic . - Monitor blood glucose levels regularly. - Ordered A1c test to assess current glycemic control.  Essential hypertension Blood pressure generally within normal range. Recent elevated reading likely due to exertion. Long-standing hypertension managed with amlodipine . - Continue amlodipine . - Monitor blood pressure at home regularly.  Hypertriglyceridemia Managed with  Crestor  and Tricor  without issues. - Continue Crestor  and Tricor .  Gout Intermittent symptoms with occasional pain, no major flare-ups. Managed with lidocaine  and dual action Tylenol . - Continue current management with lidocaine  and dual action Tylenol .  Chronic shoulder pain Managed conservatively with lidocaine  and dual action Tylenol . He prefers to avoid further evaluation. - Continue current pain management with lidocaine  and dual action Tylenol .      A1c looks good today at 6.7    Follow up plan: Return if symptoms worsen or fail to improve, for Diabetes recheck 3 to 4 months.  Counseling provided for all of the vaccine components Orders Placed This Encounter  Procedures   Bayer DCA Hb A1c Waived   CBC with Differential/Platelet   CMP14+EGFR   Lipid panel   Vitamin B12   TSH   Microalbumin / creatinine urine ratio    Fonda Levins, MD [facility] 11/29/2024, 2:14 PM     "

## 2024-11-30 LAB — CMP14+EGFR
ALT: 30 IU/L (ref 0–44)
AST: 30 IU/L (ref 0–40)
Albumin: 4.6 g/dL (ref 4.1–5.1)
Alkaline Phosphatase: 80 IU/L (ref 47–123)
BUN/Creatinine Ratio: 13 (ref 9–20)
BUN: 12 mg/dL (ref 6–24)
Bilirubin Total: 0.5 mg/dL (ref 0.0–1.2)
CO2: 21 mmol/L (ref 20–29)
Calcium: 10.1 mg/dL (ref 8.7–10.2)
Chloride: 101 mmol/L (ref 96–106)
Creatinine, Ser: 0.89 mg/dL (ref 0.76–1.27)
Globulin, Total: 2.2 g/dL (ref 1.5–4.5)
Glucose: 214 mg/dL — AB (ref 70–99)
Potassium: 4.6 mmol/L (ref 3.5–5.2)
Sodium: 137 mmol/L (ref 134–144)
Total Protein: 6.8 g/dL (ref 6.0–8.5)
eGFR: 108 mL/min/1.73

## 2024-11-30 LAB — CBC WITH DIFFERENTIAL/PLATELET
Basophils Absolute: 0 x10E3/uL (ref 0.0–0.2)
Basos: 1 %
EOS (ABSOLUTE): 0.1 x10E3/uL (ref 0.0–0.4)
Eos: 2 %
Hematocrit: 43.2 % (ref 37.5–51.0)
Hemoglobin: 14.2 g/dL (ref 13.0–17.7)
Immature Grans (Abs): 0 x10E3/uL (ref 0.0–0.1)
Immature Granulocytes: 0 %
Lymphocytes Absolute: 1.9 x10E3/uL (ref 0.7–3.1)
Lymphs: 34 %
MCH: 30.6 pg (ref 26.6–33.0)
MCHC: 32.9 g/dL (ref 31.5–35.7)
MCV: 93 fL (ref 79–97)
Monocytes Absolute: 0.4 x10E3/uL (ref 0.1–0.9)
Monocytes: 7 %
Neutrophils Absolute: 3.3 x10E3/uL (ref 1.4–7.0)
Neutrophils: 56 %
Platelets: 197 x10E3/uL (ref 150–450)
RBC: 4.64 x10E6/uL (ref 4.14–5.80)
RDW: 13.3 % (ref 11.6–15.4)
WBC: 5.7 x10E3/uL (ref 3.4–10.8)

## 2024-11-30 LAB — LIPID PANEL
Cholesterol, Total: 155 mg/dL (ref 100–199)
HDL: 39 mg/dL — AB
LDL CALC COMMENT:: 4 ratio (ref 0.0–5.0)
LDL Chol Calc (NIH): 71 mg/dL (ref 0–99)
Triglycerides: 281 mg/dL — AB (ref 0–149)
VLDL Cholesterol Cal: 45 mg/dL — AB (ref 5–40)

## 2024-11-30 LAB — VITAMIN B12: Vitamin B-12: 251 pg/mL (ref 232–1245)

## 2024-11-30 LAB — TSH: TSH: 0.952 u[IU]/mL (ref 0.450–4.500)

## 2024-12-06 ENCOUNTER — Ambulatory Visit: Payer: Self-pay | Admitting: Family Medicine

## 2025-03-16 ENCOUNTER — Ambulatory Visit: Admitting: Family Medicine
